# Patient Record
Sex: Female | Born: 1980 | Hispanic: No | Marital: Married | Smoking: Never smoker
Health system: Southern US, Community
[De-identification: ages and names within clinical notes are randomized; demographics above are authoritative.]

## PROBLEM LIST (undated history)

## (undated) ENCOUNTER — Inpatient Hospital Stay (HOSPITAL_COMMUNITY): Payer: Self-pay

## (undated) DIAGNOSIS — Z789 Other specified health status: Secondary | ICD-10-CM

## (undated) HISTORY — PX: NO PAST SURGERIES: SHX2092

---

## 2015-01-11 ENCOUNTER — Inpatient Hospital Stay (HOSPITAL_COMMUNITY): Admission: AD | Admit: 2015-01-11 | Payer: Self-pay | Source: Ambulatory Visit | Admitting: Obstetrics & Gynecology

## 2015-08-24 LAB — OB RESULTS CONSOLE HIV ANTIBODY (ROUTINE TESTING): HIV: NONREACTIVE

## 2015-08-24 LAB — OB RESULTS CONSOLE GC/CHLAMYDIA
Chlamydia: NEGATIVE
Gonorrhea: NEGATIVE

## 2015-08-24 LAB — OB RESULTS CONSOLE RUBELLA ANTIBODY, IGM: Rubella: IMMUNE

## 2015-08-24 LAB — OB RESULTS CONSOLE RPR: RPR: NONREACTIVE

## 2015-08-24 LAB — OB RESULTS CONSOLE HEPATITIS B SURFACE ANTIGEN: HEP B S AG: NEGATIVE

## 2015-09-06 ENCOUNTER — Other Ambulatory Visit: Payer: Self-pay | Admitting: Obstetrics & Gynecology

## 2015-09-06 DIAGNOSIS — E049 Nontoxic goiter, unspecified: Secondary | ICD-10-CM

## 2015-09-11 ENCOUNTER — Ambulatory Visit
Admission: RE | Admit: 2015-09-11 | Discharge: 2015-09-11 | Disposition: A | Discharge status: Home / Self Care | Payer: Self-pay | Source: Ambulatory Visit | Attending: Obstetrics & Gynecology | Admitting: Obstetrics & Gynecology

## 2015-09-11 DIAGNOSIS — E049 Nontoxic goiter, unspecified: Secondary | ICD-10-CM

## 2015-09-15 ENCOUNTER — Other Ambulatory Visit: Payer: Self-pay | Admitting: Obstetrics & Gynecology

## 2015-09-15 DIAGNOSIS — E041 Nontoxic single thyroid nodule: Secondary | ICD-10-CM

## 2015-09-27 ENCOUNTER — Ambulatory Visit
Admission: RE | Admit: 2015-09-27 | Discharge: 2015-09-27 | Disposition: A | Discharge status: Home / Self Care | Payer: 59 | Source: Ambulatory Visit | Attending: Obstetrics & Gynecology | Admitting: Obstetrics & Gynecology

## 2015-09-27 ENCOUNTER — Other Ambulatory Visit (HOSPITAL_COMMUNITY)
Admission: RE | Admit: 2015-09-27 | Discharge: 2015-09-27 | Disposition: A | Discharge status: Home / Self Care | Payer: 59 | Source: Ambulatory Visit | Attending: Physician Assistant | Admitting: Physician Assistant

## 2015-09-27 DIAGNOSIS — E041 Nontoxic single thyroid nodule: Secondary | ICD-10-CM

## 2015-09-27 NOTE — Procedures (Signed)
Using direct ultrasound guidance, 4 passes were made using needles into the nodule within the right lobe of the thyroid.   Ultrasound was used to confirm needle placements on all occasions.   Specimens were sent to Pathology for analysis.  Lorenza Winkleman S Dareon Nunziato PA-C 09/27/2015 8:44 AM

## 2015-11-24 ENCOUNTER — Inpatient Hospital Stay (HOSPITAL_COMMUNITY): Payer: 59

## 2015-11-24 ENCOUNTER — Encounter (HOSPITAL_COMMUNITY): Payer: Self-pay | Admitting: *Deleted

## 2015-11-24 ENCOUNTER — Inpatient Hospital Stay (HOSPITAL_COMMUNITY)
Admission: AD | Admit: 2015-11-24 | Discharge: 2015-11-24 | Disposition: A | Discharge status: Home / Self Care | Payer: 59 | Source: Ambulatory Visit | Attending: Obstetrics and Gynecology | Admitting: Obstetrics and Gynecology

## 2015-11-24 DIAGNOSIS — O26852 Spotting complicating pregnancy, second trimester: Secondary | ICD-10-CM | POA: Diagnosis not present

## 2015-11-24 DIAGNOSIS — K5900 Constipation, unspecified: Secondary | ICD-10-CM | POA: Diagnosis not present

## 2015-11-24 DIAGNOSIS — O26851 Spotting complicating pregnancy, first trimester: Secondary | ICD-10-CM

## 2015-11-24 DIAGNOSIS — Z3A23 23 weeks gestation of pregnancy: Secondary | ICD-10-CM

## 2015-11-24 HISTORY — DX: Other specified health status: Z78.9

## 2015-11-24 LAB — URINALYSIS, ROUTINE W REFLEX MICROSCOPIC
Bilirubin Urine: NEGATIVE
Glucose, UA: NEGATIVE mg/dL
Hgb urine dipstick: NEGATIVE
Ketones, ur: 15 mg/dL — AB
LEUKOCYTES UA: NEGATIVE
NITRITE: NEGATIVE
PH: 5.5 (ref 5.0–8.0)
Protein, ur: NEGATIVE mg/dL
SPECIFIC GRAVITY, URINE: 1.01 (ref 1.005–1.030)

## 2015-11-24 NOTE — MAU Provider Note (Signed)
History     CSN: [id number]  Arrival date and time: 11/24/15 1432   First Provider Initiated Contact with Patient 11/24/15 1521      Chief Complaint  Patient presents with  . Vaginal Bleeding   HPI Ms. Stephanie Webster is a 35 y.o. G2P1001 at 7279w3d who presents to MAU today with complaint of spotting since last night. She has not needed a pad. She states only light pink noted with wiping. She states that she has been constipated, but had multiple BMs yesterday and initially thought this might be rectal bleeding. She now thinks it is vaginal bleeding since it has continued today. She denies abdominal pain, contractions, LOF, UTI symptoms, recent intercourse or complications with the pregnancy. She reports normal fetal movement.  OB History    Gravida Para Term Preterm AB TAB SAB Ectopic Multiple Living   2 1 1       1       Past Medical History  Diagnosis Date  . Medical history non-contributory     Past Surgical History  Procedure Laterality Date  . No past surgeries      History reviewed. No pertinent family history.  Social History  Substance Use Topics  . Smoking status: Never Smoker   . Smokeless tobacco: None  . Alcohol Use: No    Allergies: No Known Allergies  No prescriptions prior to admission    Review of Systems  Constitutional: Negative for fever and malaise/fatigue.  Gastrointestinal: Positive for constipation. Negative for nausea, vomiting, abdominal pain and diarrhea.  Genitourinary: Negative for dysuria, urgency and frequency.       + vaginal bleeding Neg - LOF   Physical Exam   Blood pressure 117/74, pulse 80, temperature 98.3 F (36.8 C), temperature source Oral, resp. rate 16, last menstrual period 06/13/2015.  Physical Exam  Nursing note and vitals reviewed. Constitutional: She is oriented to person, place, and time. She appears well-developed and well-nourished. No distress.  HENT:  Head: Normocephalic and atraumatic.   Cardiovascular: Normal rate.   Respiratory: Effort normal.  GI: Soft. She exhibits no distension and no mass. There is no tenderness. There is no rebound and no guarding.  Genitourinary: Uterus is enlarged. Uterus is not tender. Cervix exhibits no motion tenderness, no discharge and no friability. No bleeding in the vagina. Vaginal discharge (small amount of mucus discharge noted) found.  Neurological: She is alert and oriented to person, place, and time.  Skin: Skin is warm and dry. No erythema.  Psychiatric: She has a normal mood and affect.    Results for orders placed or performed during the hospital encounter of 11/24/15 (from the past 24 hour(s))  Urinalysis, Routine w reflex microscopic (not at [facility])     Status: Abnormal   Collection Time: 11/24/15  2:35 PM  Result Value Ref Range   Color, Urine YELLOW YELLOW   APPearance CLEAR CLEAR   Specific Gravity, Urine 1.010 1.005 - 1.030   pH 5.5 5.0 - 8.0   Glucose, UA NEGATIVE NEGATIVE mg/dL   Hgb urine dipstick NEGATIVE NEGATIVE   Bilirubin Urine NEGATIVE NEGATIVE   Ketones, ur 15 (A) NEGATIVE mg/dL   Protein, ur NEGATIVE NEGATIVE mg/dL   Nitrite NEGATIVE NEGATIVE   Leukocytes, UA NEGATIVE NEGATIVE    Fetal Monitoring: Baseline: 140 bpm Variability: moderate Accelerations: 10 x 10 Decelerations: none Contractions: none  MAU Course  Procedures None  MDM UA today Discussed patient with Dr Thana AtesGrewel. Recommends US today for placental location and cervical length prior  to vaginal exam.  Preliminary US shows no evidence of placenta previa or abruption, normal AFI and cervical length of 3.6 cm Discussed results with Dr. Thana Ates. Ok for discharge at this time. Follow-up as scheduled.  Assessment and Plan  A: SIUP at [redacted]w[redacted]d Spotting in pregnancy, second trimester  P: Discharge home Bleeding/preterm labor precautions discussed Patient advised to follow-up with [facility] as scheduled for routine prenatal care or  sooner PRN Patient may return to MAU as needed or if her condition were to change or worsen   Stephanie Lowenstein, PA-C  11/24/2015, 5:32 PM

## 2015-11-24 NOTE — MAU Note (Signed)
Pt states she had pink spotting yesterday and today when she used the bathroom.

## 2015-11-24 NOTE — Discharge Instructions (Signed)
Vaginal Bleeding During Pregnancy, Second Trimester ° A small amount of bleeding (spotting) from the vagina is common in pregnancy. Sometimes the bleeding is normal and is not a problem, and sometimes it is a sign of something serious. Be sure to tell your doctor about any bleeding from your vagina right away. °HOME CARE °· Watch your condition for any changes. °· Follow your doctor's instructions about how active you can be. °· If you are on bed rest: °¨ You may need to stay in bed and only get up to use the bathroom. °¨ You may be allowed to do some activities. °¨ If you need help, make plans for someone to help you. °· Write down: °¨ The number of pads you use each day. °¨ How often you change pads. °¨ How soaked (saturated) your pads are. °· Do not use tampons. °· Do not douche. °· Do not have sex or orgasms until your doctor says it is okay. °· If you pass any tissue from your vagina, save the tissue so you can show it to your doctor. °· Only take medicines as told by your doctor. °· Do not take aspirin because it can make you bleed. °· Do not exercise, lift heavy weights, or do any activities that take a lot of energy and effort unless your doctor says it is okay. °· Keep all follow-up visits as told by your doctor. °GET HELP IF:  °· You bleed from your vagina. °· You have cramps. °· You have labor pains. °· You have a fever that does not go away after you take medicine. °GET HELP RIGHT AWAY IF: °· You have very bad cramps in your back or belly (abdomen). °· You have contractions. °· You have chills. °· You pass large clots or tissue from your vagina. °· You bleed more. °· You feel light-headed or weak. °· You pass out (faint). °· You are leaking fluid or have a gush of fluid from your vagina. °MAKE SURE YOU: °· Understand these instructions. °· Will watch your condition. °· Will get help right away if you are not doing well or get worse. °  °This information is not intended to replace advice given to you by  your health care provider. Make sure you discuss any questions you have with your health care provider. °  °Document Released: 12/13/2013 Document Reviewed: 12/13/2013 °Elsevier Interactive Patient Education ©2016 [employer] ° °

## 2016-01-18 LAB — OB RESULTS CONSOLE GBS: GBS: NEGATIVE

## 2016-03-17 ENCOUNTER — Inpatient Hospital Stay (HOSPITAL_COMMUNITY): Payer: 59 | Admitting: Anesthesiology

## 2016-03-17 ENCOUNTER — Encounter (HOSPITAL_COMMUNITY)
Admission: AD | Disposition: A | Discharge status: Home / Self Care | Payer: Self-pay | Source: Ambulatory Visit | Attending: Obstetrics & Gynecology

## 2016-03-17 ENCOUNTER — Encounter (HOSPITAL_COMMUNITY): Payer: Self-pay | Admitting: *Deleted

## 2016-03-17 ENCOUNTER — Inpatient Hospital Stay (HOSPITAL_COMMUNITY)
Admission: AD | Admit: 2016-03-17 | Discharge: 2016-03-21 | DRG: 765 | Disposition: A | Discharge status: Home / Self Care | Payer: 59 | Source: Ambulatory Visit | Attending: Obstetrics & Gynecology | Admitting: Obstetrics & Gynecology

## 2016-03-17 DIAGNOSIS — Z3490 Encounter for supervision of normal pregnancy, unspecified, unspecified trimester: Secondary | ICD-10-CM

## 2016-03-17 DIAGNOSIS — Z98891 History of uterine scar from previous surgery: Secondary | ICD-10-CM

## 2016-03-17 DIAGNOSIS — Z3A39 39 weeks gestation of pregnancy: Secondary | ICD-10-CM | POA: Diagnosis not present

## 2016-03-17 DIAGNOSIS — O324XX0 Maternal care for high head at term, not applicable or unspecified: Secondary | ICD-10-CM | POA: Diagnosis present

## 2016-03-17 DIAGNOSIS — O4202 Full-term premature rupture of membranes, onset of labor within 24 hours of rupture: Secondary | ICD-10-CM | POA: Diagnosis present

## 2016-03-17 DIAGNOSIS — O411230 Chorioamnionitis, third trimester, not applicable or unspecified: Secondary | ICD-10-CM | POA: Diagnosis present

## 2016-03-17 DIAGNOSIS — E041 Nontoxic single thyroid nodule: Secondary | ICD-10-CM | POA: Diagnosis present

## 2016-03-17 DIAGNOSIS — O99284 Endocrine, nutritional and metabolic diseases complicating childbirth: Secondary | ICD-10-CM | POA: Diagnosis present

## 2016-03-17 LAB — CBC
HCT: 37.6 % (ref 36.0–46.0)
HEMOGLOBIN: 12.7 g/dL (ref 12.0–15.0)
MCH: 28.8 pg (ref 26.0–34.0)
MCHC: 33.8 g/dL (ref 30.0–36.0)
MCV: 85.3 fL (ref 78.0–100.0)
PLATELETS: 241 10*3/uL (ref 150–400)
RBC: 4.41 MIL/uL (ref 3.87–5.11)
RDW: 13.9 % (ref 11.5–15.5)
WBC: 6.5 10*3/uL (ref 4.0–10.5)

## 2016-03-17 LAB — ABO/RH: ABO/RH(D): B POS

## 2016-03-17 LAB — TYPE AND SCREEN
ABO/RH(D): B POS
Antibody Screen: NEGATIVE

## 2016-03-17 LAB — POCT FERN TEST: POCT FERN TEST: POSITIVE

## 2016-03-17 SURGERY — Surgical Case
Anesthesia: Epidural

## 2016-03-17 MED ORDER — SODIUM CHLORIDE 0.9 % IV SOLN
2.0000 g | Freq: Four times a day (QID) | INTRAVENOUS | Status: DC
  Administered 2016-03-17: 2 g via INTRAVENOUS
  Filled 2016-03-17 (×5): qty 2000

## 2016-03-17 MED ORDER — LIDOCAINE HCL (PF) 1 % IJ SOLN
INTRAMUSCULAR | Status: DC | PRN
  Administered 2016-03-17: 6 mL via EPIDURAL
  Administered 2016-03-17: 4 mL

## 2016-03-17 MED ORDER — FENTANYL 2.5 MCG/ML BUPIVACAINE 1/10 % EPIDURAL INFUSION (WH - ANES)
INTRAMUSCULAR | Status: AC
  Filled 2016-03-17: qty 125

## 2016-03-17 MED ORDER — EPHEDRINE 5 MG/ML INJ: 10.0000 mg | INTRAVENOUS | Status: DC | PRN

## 2016-03-17 MED ORDER — BUTORPHANOL TARTRATE 1 MG/ML IJ SOLN: 1.0000 mg | INTRAMUSCULAR | Status: DC | PRN

## 2016-03-17 MED ORDER — TERBUTALINE SULFATE 1 MG/ML IJ SOLN: 0.2500 mg | Freq: Once | INTRAMUSCULAR | Status: DC | PRN

## 2016-03-17 MED ORDER — PHENYLEPHRINE 40 MCG/ML (10ML) SYRINGE FOR IV PUSH (FOR BLOOD PRESSURE SUPPORT)
80.0000 ug | PREFILLED_SYRINGE | INTRAVENOUS | Status: DC | PRN
  Administered 2016-03-17: 80 ug via INTRAVENOUS

## 2016-03-17 MED ORDER — FENTANYL 2.5 MCG/ML BUPIVACAINE 1/10 % EPIDURAL INFUSION (WH - ANES)
14.0000 mL/h | INTRAMUSCULAR | Status: DC | PRN
  Administered 2016-03-17: 14 mL/h via EPIDURAL

## 2016-03-17 MED ORDER — OXYTOCIN 40 UNITS IN LACTATED RINGERS INFUSION - SIMPLE MED
1.0000 m[IU]/min | INTRAVENOUS | Status: DC
  Administered 2016-03-17: 2 m[IU]/min via INTRAVENOUS
  Filled 2016-03-17: qty 1000

## 2016-03-17 MED ORDER — CLINDAMYCIN PHOSPHATE 900 MG/50ML IV SOLN
INTRAVENOUS | Status: DC | PRN
  Administered 2016-03-17: 900 mg via INTRAVENOUS

## 2016-03-17 MED ORDER — SODIUM BICARBONATE 8.4 % IV SOLN
INTRAVENOUS | Status: DC | PRN
  Administered 2016-03-17 (×2): 5 mL via EPIDURAL

## 2016-03-17 MED ORDER — OXYTOCIN BOLUS FROM INFUSION: 500.0000 mL | Freq: Once | INTRAVENOUS | Status: DC

## 2016-03-17 MED ORDER — GENTAMICIN SULFATE 40 MG/ML IJ SOLN
INTRAVENOUS | Status: DC | PRN
  Administered 2016-03-17: 320 mg via INTRAVENOUS

## 2016-03-17 MED ORDER — DIPHENHYDRAMINE HCL 50 MG/ML IJ SOLN: 12.5000 mg | INTRAMUSCULAR | Status: DC | PRN

## 2016-03-17 MED ORDER — LACTATED RINGERS IV SOLN
500.0000 mL | Freq: Once | INTRAVENOUS | Status: AC
  Administered 2016-03-17: 500 mL via INTRAVENOUS

## 2016-03-17 MED ORDER — OXYCODONE-ACETAMINOPHEN 5-325 MG PO TABS: 2.0000 | ORAL_TABLET | ORAL | Status: DC | PRN

## 2016-03-17 MED ORDER — LIDOCAINE HCL (PF) 1 % IJ SOLN
30.0000 mL | INTRAMUSCULAR | Status: DC | PRN
  Filled 2016-03-17: qty 30

## 2016-03-17 MED ORDER — LACTATED RINGERS IV SOLN
500.0000 mL | INTRAVENOUS | Status: DC | PRN
  Administered 2016-03-17: 500 mL via INTRAVENOUS

## 2016-03-17 MED ORDER — LACTATED RINGERS IV SOLN
INTRAVENOUS | Status: DC
  Administered 2016-03-17 (×3): via INTRAVENOUS

## 2016-03-17 MED ORDER — ACETAMINOPHEN 325 MG PO TABS: 650.0000 mg | ORAL_TABLET | ORAL | Status: DC | PRN

## 2016-03-17 MED ORDER — SOD CITRATE-CITRIC ACID 500-334 MG/5ML PO SOLN
30.0000 mL | ORAL | Status: DC | PRN
  Administered 2016-03-17: 30 mL via ORAL
  Filled 2016-03-17: qty 15

## 2016-03-17 MED ORDER — CLINDAMYCIN PHOSPHATE 900 MG/50ML IV SOLN
900.0000 mg | Freq: Once | INTRAVENOUS | Status: DC
  Filled 2016-03-17: qty 50

## 2016-03-17 MED ORDER — EPHEDRINE SULFATE 50 MG/ML IJ SOLN
INTRAMUSCULAR | Status: DC | PRN
  Administered 2016-03-17: 5 mg via INTRAVENOUS

## 2016-03-17 MED ORDER — GENTAMICIN SULFATE 40 MG/ML IJ SOLN
5.0000 mg/kg | Freq: Once | INTRAVENOUS | Status: DC
  Filled 2016-03-17: qty 8

## 2016-03-17 MED ORDER — OXYTOCIN 40 UNITS IN LACTATED RINGERS INFUSION - SIMPLE MED: 2.5000 [IU]/h | INTRAVENOUS | Status: DC

## 2016-03-17 MED ORDER — FLEET ENEMA 7-19 GM/118ML RE ENEM: 1.0000 | ENEMA | RECTAL | Status: DC | PRN

## 2016-03-17 MED ORDER — PHENYLEPHRINE 40 MCG/ML (10ML) SYRINGE FOR IV PUSH (FOR BLOOD PRESSURE SUPPORT): 80.0000 ug | PREFILLED_SYRINGE | INTRAVENOUS | Status: DC | PRN

## 2016-03-17 MED ORDER — PHENYLEPHRINE 40 MCG/ML (10ML) SYRINGE FOR IV PUSH (FOR BLOOD PRESSURE SUPPORT)
PREFILLED_SYRINGE | INTRAVENOUS | Status: AC
  Filled 2016-03-17: qty 20

## 2016-03-17 MED ORDER — ONDANSETRON HCL 4 MG/2ML IJ SOLN: 4.0000 mg | Freq: Four times a day (QID) | INTRAMUSCULAR | Status: DC | PRN

## 2016-03-17 MED ORDER — MEPERIDINE HCL 25 MG/ML IJ SOLN
INTRAMUSCULAR | Status: AC
  Filled 2016-03-17: qty 1

## 2016-03-17 MED ORDER — OXYCODONE-ACETAMINOPHEN 5-325 MG PO TABS: 1.0000 | ORAL_TABLET | ORAL | Status: DC | PRN

## 2016-03-17 SURGICAL SUPPLY — 35 items
BENZOIN TINCTURE PRP APPL 2/3 (GAUZE/BANDAGES/DRESSINGS) ×3 IMPLANT
CHLORAPREP W/TINT 26ML (MISCELLANEOUS) ×3 IMPLANT
CLAMP CORD UMBIL (MISCELLANEOUS) IMPLANT
CLOSURE STERI-STRIP 1/2X4 (GAUZE/BANDAGES/DRESSINGS) ×1
CLOSURE WOUND 1/2 X4 (GAUZE/BANDAGES/DRESSINGS) ×1
CLOTH BEACON ORANGE TIMEOUT ST (SAFETY) IMPLANT
CLSR STERI-STRIP ANTIMIC 1/2X4 (GAUZE/BANDAGES/DRESSINGS) ×2 IMPLANT
DRSG OPSITE POSTOP 4X10 (GAUZE/BANDAGES/DRESSINGS) ×3 IMPLANT
ELECT REM PT RETURN 9FT ADLT (ELECTROSURGICAL) ×3
ELECTRODE REM PT RTRN 9FT ADLT (ELECTROSURGICAL) ×1 IMPLANT
EXTRACTOR VACUUM KIWI (MISCELLANEOUS) IMPLANT
GLOVE BIO SURGEON STRL SZ 6 (GLOVE) ×3 IMPLANT
GLOVE BIOGEL PI IND STRL 6 (GLOVE) ×2 IMPLANT
GLOVE BIOGEL PI IND STRL 7.0 (GLOVE) ×1 IMPLANT
GLOVE BIOGEL PI INDICATOR 6 (GLOVE) ×4
GLOVE BIOGEL PI INDICATOR 7.0 (GLOVE) ×2
GOWN STRL REUS W/TWL LRG LVL3 (GOWN DISPOSABLE) ×6 IMPLANT
KIT ABG SYR 3ML LUER SLIP (SYRINGE) IMPLANT
LIQUID BAND (GAUZE/BANDAGES/DRESSINGS) IMPLANT
NEEDLE HYPO 25X5/8 SAFETYGLIDE (NEEDLE) ×3 IMPLANT
NS IRRIG 1000ML POUR BTL (IV SOLUTION) ×3 IMPLANT
PACK C SECTION WH (CUSTOM PROCEDURE TRAY) ×3 IMPLANT
PAD OB MATERNITY 4.3X12.25 (PERSONAL CARE ITEMS) ×3 IMPLANT
PENCIL SMOKE EVAC W/HOLSTER (ELECTROSURGICAL) ×3 IMPLANT
SPONGE SURGIFOAM ABS GEL 12-7 (HEMOSTASIS) ×3 IMPLANT
STRIP CLOSURE SKIN 1/2X4 (GAUZE/BANDAGES/DRESSINGS) ×2 IMPLANT
SUT CHROMIC 0 CTX 36 (SUTURE) ×12 IMPLANT
SUT CHROMIC 3 0 SH 27 (SUTURE) ×3 IMPLANT
SUT MON AB 2-0 CT1 27 (SUTURE) ×3 IMPLANT
SUT PDS AB 0 CT1 27 (SUTURE) IMPLANT
SUT PLAIN 0 NONE (SUTURE) IMPLANT
SUT VIC AB 0 CT1 36 (SUTURE) ×6 IMPLANT
SUT VIC AB 4-0 KS 27 (SUTURE) ×3 IMPLANT
TOWEL OR 17X24 6PK STRL BLUE (TOWEL DISPOSABLE) ×3 IMPLANT
TRAY FOLEY CATH SILVER 14FR (SET/KITS/TRAYS/PACK) IMPLANT

## 2016-03-17 NOTE — Progress Notes (Signed)
Dr. Langston MaskerMorris notified of a G2P1 at 4975w5d.  Notified that pt is fern positive.  Notified that pt is 2, 80, -3, and vertex.  Notified that pt is having no contractions.  Provider states to go ahead and release the admission orders for her scheduled induction.

## 2016-03-17 NOTE — Anesthesia Procedure Notes (Signed)

## 2016-03-17 NOTE — Progress Notes (Signed)
The patient has been pushing effectively x 2 hours with minimal descent in fetal vertex; noted narrow pubic arch.  The patient now has chorioamnionitis and fetal tachycardia.  The patient is offered vacuum assistance and decline based on risk.  She declines further pushing efforts and would like to proceed to primary C/S.  She is informed of the risk of bleeding, infection, scarring and damage to surrounding structures.  She understands the 1% risk of uterine rupture in subsequent pregnancies.  All of her questions were answered and the patient wishes to proceed.  Mitchel HonourMegan Herman Mell, DO

## 2016-03-17 NOTE — H&P (Signed)
Stephanie Webster is a 35 y.o. female presenting for PROM when she awoke at 630 am today.  No CTX.  Active FM.  No VB.  Antepartum course uncomplicated except a thyroid nodule; s/p eval by ENT with planned postpartum follow up.  GBS negative.  OB History    Gravida Para Term Preterm AB Living   2 1 1     1    SAB TAB Ectopic Multiple Live Births                 Past Medical History:  Diagnosis Date  . Medical history non-contributory    Past Surgical History:  Procedure Laterality Date  . NO PAST SURGERIES     Family History: family history is not on file. Social History:  reports that she has never smoked. She has never used smokeless tobacco. She reports that she does not drink alcohol or use drugs.     Maternal Diabetes: No Genetic Screening: Normal Maternal Ultrasounds/Referrals: Normal Fetal Ultrasounds or other Referrals:  None Maternal Substance Abuse:  No Significant Maternal Medications:  None Significant Maternal Lab Results:  Lab values include: Group B Strep negative Other Comments:  None  ROS Maternal Medical History:  Reason for admission: Rupture of membranes.   Contractions: Frequency: rare.   Perceived severity is mild.    Fetal activity: Perceived fetal activity is normal.   Last perceived fetal movement was within the past hour.    Prenatal complications: no prenatal complications Prenatal Complications - Diabetes: none.    Dilation: 2 Effacement (%): 80 Station: -3 Exam by:: Ronna PolioElizabeth D'Andrea, RN Blood pressure 115/78, pulse 102, temperature 97.6 F (36.4 C), temperature source Oral, resp. rate 18, last menstrual period 06/13/2015. Maternal Exam:  Uterine Assessment: Contraction strength is mild.  Contraction frequency is rare.   Abdomen: Patient reports no abdominal tenderness. Fundal height is c/w dates.   Estimated fetal weight is 7#6.       Physical Exam  Constitutional: She is oriented to person, place, and time. She appears  well-developed and well-nourished.  GI: Soft. There is no rebound and no guarding.  Neurological: She is alert and oriented to person, place, and time.  Skin: Skin is warm and dry.  Psychiatric: She has a normal mood and affect. Her behavior is normal.    Prenatal labs: ABO, Rh: --/--/B POS (08/06 16100916) Antibody: NEG (08/06 0916) Rubella:   RPR:    HBsAg:    HIV:    GBS:     Assessment/Plan: 35yo G3P1011 at 7245w5d with PROM -Start pitocin -Epidural when ready -Anticipate NSVD  Stephanie Webster 03/17/2016, 1:40 PM

## 2016-03-17 NOTE — MAU Note (Signed)
Pt states her water broke around 0630 this morning.  Pt states she had clear watery fluid when she woke up and has also had some white discharged mixed in.  Pt denies contractions.

## 2016-03-17 NOTE — Anesthesia Preprocedure Evaluation (Signed)

## 2016-03-17 NOTE — Anesthesia Pain Management Evaluation Note (Signed)
  CRNA Pain Management Visit Note  Patient: Stephanie Webster, 35 y.o., female  "Hello I am a member of the anesthesia team at [facility]. We have an anesthesia team available at all times to provide care throughout the hospital, including epidural management and anesthesia for C-section. I don't know your plan for the delivery whether it a natural birth, water birth, IV sedation, nitrous supplementation, doula or epidural, but we want to meet your pain goals."   1.Was your pain managed to your expectations on prior hospitalizations?   Yes   2.What is your expectation for pain management during this hospitalization?     Epidural  3.How can we help you reach that goal? Patient would like epidural when labor progresses   Record the patient's initial score and the patient's pain goal.   Pain: 0  Pain Goal: patient unsure [facility] wants you to be able to say your pain was always managed very well.  Rica RecordsICKELTON,Stephanie Webster 03/17/2016

## 2016-03-17 NOTE — Progress Notes (Signed)
Dr. Langston MaskerMorris notified that there is not a bed on Labor and Delivery available for the pt so the pt is being held in MAU.  RN requested that the pt be able to order a meal try and then get up and walk around since pt FHR tracing is reactive and pt is not having contractions.  Provider states that is ok by her.

## 2016-03-18 ENCOUNTER — Encounter (HOSPITAL_COMMUNITY): Payer: Self-pay | Admitting: *Deleted

## 2016-03-18 ENCOUNTER — Inpatient Hospital Stay (HOSPITAL_COMMUNITY): Admission: RE | Admit: 2016-03-18 | Payer: 59 | Source: Ambulatory Visit

## 2016-03-18 DIAGNOSIS — Z98891 History of uterine scar from previous surgery: Secondary | ICD-10-CM

## 2016-03-18 LAB — CBC
HCT: 32.6 % — ABNORMAL LOW (ref 36.0–46.0)
Hemoglobin: 11.2 g/dL — ABNORMAL LOW (ref 12.0–15.0)
MCH: 29.4 pg (ref 26.0–34.0)
MCHC: 34.4 g/dL (ref 30.0–36.0)
MCV: 85.6 fL (ref 78.0–100.0)
PLATELETS: 217 10*3/uL (ref 150–400)
RBC: 3.81 MIL/uL — AB (ref 3.87–5.11)
RDW: 13.9 % (ref 11.5–15.5)
WBC: 12.3 10*3/uL — AB (ref 4.0–10.5)

## 2016-03-18 LAB — RPR: RPR Ser Ql: NONREACTIVE

## 2016-03-18 MED ORDER — OXYTOCIN 10 UNIT/ML IJ SOLN
INTRAVENOUS | Status: DC | PRN
  Administered 2016-03-18: 40 [IU] via INTRAVENOUS

## 2016-03-18 MED ORDER — SIMETHICONE 80 MG PO CHEW
80.0000 mg | CHEWABLE_TABLET | ORAL | Status: DC
  Administered 2016-03-19 – 2016-03-21 (×2): 80 mg via ORAL
  Filled 2016-03-18 (×3): qty 1

## 2016-03-18 MED ORDER — TETANUS-DIPHTH-ACELL PERTUSSIS 5-2.5-18.5 LF-MCG/0.5 IM SUSP: 0.5000 mL | Freq: Once | INTRAMUSCULAR | Status: DC

## 2016-03-18 MED ORDER — SENNOSIDES-DOCUSATE SODIUM 8.6-50 MG PO TABS
2.0000 | ORAL_TABLET | ORAL | Status: DC
  Administered 2016-03-19 – 2016-03-21 (×2): 2 via ORAL
  Filled 2016-03-18 (×3): qty 2

## 2016-03-18 MED ORDER — IBUPROFEN 600 MG PO TABS
600.0000 mg | ORAL_TABLET | Freq: Four times a day (QID) | ORAL | Status: DC
  Administered 2016-03-18 – 2016-03-21 (×13): 600 mg via ORAL
  Filled 2016-03-18 (×14): qty 1

## 2016-03-18 MED ORDER — DEXAMETHASONE SODIUM PHOSPHATE 4 MG/ML IJ SOLN
INTRAMUSCULAR | Status: AC
  Filled 2016-03-18: qty 1

## 2016-03-18 MED ORDER — LACTATED RINGERS IV SOLN
INTRAVENOUS | Status: DC | PRN
  Administered 2016-03-17: via INTRAVENOUS

## 2016-03-18 MED ORDER — DIBUCAINE 1 % RE OINT: 1.0000 "application " | TOPICAL_OINTMENT | RECTAL | Status: DC | PRN

## 2016-03-18 MED ORDER — MEPERIDINE HCL 25 MG/ML IJ SOLN
INTRAMUSCULAR | Status: DC | PRN
  Administered 2016-03-18: 25 mg via INTRAVENOUS

## 2016-03-18 MED ORDER — OXYTOCIN 40 UNITS IN LACTATED RINGERS INFUSION - SIMPLE MED: 2.5000 [IU]/h | INTRAVENOUS | Status: AC

## 2016-03-18 MED ORDER — DEXAMETHASONE SODIUM PHOSPHATE 4 MG/ML IJ SOLN
INTRAMUSCULAR | Status: DC | PRN
  Administered 2016-03-18: 4 mg via INTRAVENOUS

## 2016-03-18 MED ORDER — MORPHINE SULFATE (PF) 0.5 MG/ML IJ SOLN
INTRAMUSCULAR | Status: AC
  Filled 2016-03-18: qty 10

## 2016-03-18 MED ORDER — ONDANSETRON HCL 4 MG/2ML IJ SOLN
INTRAMUSCULAR | Status: AC
  Filled 2016-03-18: qty 2

## 2016-03-18 MED ORDER — LACTATED RINGERS IV SOLN
INTRAVENOUS | Status: DC
  Administered 2016-03-18 (×2): via INTRAVENOUS

## 2016-03-18 MED ORDER — SIMETHICONE 80 MG PO CHEW: 80.0000 mg | CHEWABLE_TABLET | ORAL | Status: DC | PRN

## 2016-03-18 MED ORDER — PHENYLEPHRINE HCL 10 MG/ML IJ SOLN
INTRAMUSCULAR | Status: DC | PRN
  Administered 2016-03-18 (×3): 80 ug via INTRAVENOUS
  Administered 2016-03-18: 40 ug via INTRAVENOUS
  Administered 2016-03-18: 80 ug via INTRAVENOUS
  Administered 2016-03-18 (×2): 40 ug via INTRAVENOUS
  Administered 2016-03-18: 80 ug via INTRAVENOUS

## 2016-03-18 MED ORDER — DIPHENHYDRAMINE HCL 25 MG PO CAPS: 25.0000 mg | ORAL_CAPSULE | Freq: Four times a day (QID) | ORAL | Status: DC | PRN

## 2016-03-18 MED ORDER — MENTHOL 3 MG MT LOZG: 1.0000 | LOZENGE | OROMUCOSAL | Status: DC | PRN

## 2016-03-18 MED ORDER — PRENATAL MULTIVITAMIN CH
1.0000 | ORAL_TABLET | Freq: Every day | ORAL | Status: DC
  Administered 2016-03-18 – 2016-03-21 (×4): 1 via ORAL
  Filled 2016-03-18 (×4): qty 1

## 2016-03-18 MED ORDER — OXYCODONE-ACETAMINOPHEN 5-325 MG PO TABS: 2.0000 | ORAL_TABLET | ORAL | Status: DC | PRN

## 2016-03-18 MED ORDER — SIMETHICONE 80 MG PO CHEW
80.0000 mg | CHEWABLE_TABLET | Freq: Three times a day (TID) | ORAL | Status: DC
  Administered 2016-03-18 – 2016-03-21 (×7): 80 mg via ORAL
  Filled 2016-03-18 (×8): qty 1

## 2016-03-18 MED ORDER — ACETAMINOPHEN 325 MG PO TABS: 650.0000 mg | ORAL_TABLET | ORAL | Status: DC | PRN

## 2016-03-18 MED ORDER — OXYTOCIN 10 UNIT/ML IJ SOLN
INTRAMUSCULAR | Status: AC
  Filled 2016-03-18: qty 4

## 2016-03-18 MED ORDER — FENTANYL CITRATE (PF) 100 MCG/2ML IJ SOLN
INTRAMUSCULAR | Status: DC | PRN
  Administered 2016-03-18 (×2): 50 ug via INTRAVENOUS

## 2016-03-18 MED ORDER — WITCH HAZEL-GLYCERIN EX PADS: 1.0000 "application " | MEDICATED_PAD | CUTANEOUS | Status: DC | PRN

## 2016-03-18 MED ORDER — PHENYLEPHRINE 40 MCG/ML (10ML) SYRINGE FOR IV PUSH (FOR BLOOD PRESSURE SUPPORT)
PREFILLED_SYRINGE | INTRAVENOUS | Status: AC
  Filled 2016-03-18: qty 20

## 2016-03-18 MED ORDER — SCOPOLAMINE 1 MG/3DAYS TD PT72
MEDICATED_PATCH | TRANSDERMAL | Status: AC
  Filled 2016-03-18: qty 1

## 2016-03-18 MED ORDER — COCONUT OIL OIL: 1.0000 "application " | TOPICAL_OIL | Status: DC | PRN

## 2016-03-18 MED ORDER — ZOLPIDEM TARTRATE 5 MG PO TABS: 5.0000 mg | ORAL_TABLET | Freq: Every evening | ORAL | Status: DC | PRN

## 2016-03-18 MED ORDER — MORPHINE SULFATE (PF) 0.5 MG/ML IJ SOLN
INTRAMUSCULAR | Status: DC | PRN
  Administered 2016-03-18: .5 mg via INTRAVENOUS
  Administered 2016-03-18: 4 mg via EPIDURAL
  Administered 2016-03-18: .5 mg via INTRAVENOUS

## 2016-03-18 MED ORDER — OXYCODONE-ACETAMINOPHEN 5-325 MG PO TABS
1.0000 | ORAL_TABLET | ORAL | Status: DC | PRN
Start: 2016-03-18 — End: 2016-03-21

## 2016-03-18 MED ORDER — ONDANSETRON HCL 4 MG/2ML IJ SOLN
INTRAMUSCULAR | Status: DC | PRN
  Administered 2016-03-18: 4 mg via INTRAVENOUS

## 2016-03-18 MED ORDER — FENTANYL CITRATE (PF) 100 MCG/2ML IJ SOLN
INTRAMUSCULAR | Status: AC
  Filled 2016-03-18: qty 2

## 2016-03-18 MED ORDER — EPHEDRINE 5 MG/ML INJ
INTRAVENOUS | Status: AC
  Filled 2016-03-18: qty 10

## 2016-03-18 NOTE — Lactation Note (Signed)
This note was copied from a baby's chart. Lactation Consultation Note  Patient Name: Stephanie Renaldo HarrisonDeepti Clipper ZOXWR'UToday's Date: 03/18/2016 Reason for consult: Follow-up assessment;Difficult latch Baby 13 hours old. Mom reports that she has just given baby a few drops of EBM with spoon. Assisted mom to latch baby to right breast in football position. Baby fussy at breast and not wanting to latch. Mom not able to express colostrum from right breast. Baby would not suckle this LC's gloved finger, but was biting and chomping instead. Assisted mom to hand express and give baby a few more drops of colostrum from left breast. Set mom up with DEBP and reviewed use. Enc mom to put baby to breast with cues, supplement with EBM using spoon, and then post-pump for 15 minutes. Enc mom to offer lots of STS and attempts at the breast, and to pump at least every 2-3 hours. Discussed the normal progression of milk coming to volume and supply and demand. Enc mom to call for assistance as needed.   Maternal Data    Feeding Feeding Type: Breast Fed Length of feed: 0 min  LATCH Score/Interventions Latch: Too sleepy or reluctant, no latch achieved, no sucking elicited. Intervention(s): Skin to skin Intervention(s): Adjust position;Assist with latch;Breast compression  Audible Swallowing: None Intervention(s): Skin to skin;Hand expression  Type of Nipple: Everted at rest and after stimulation  Comfort (Breast/Nipple): Soft / non-tender     Hold (Positioning): Assistance needed to correctly position infant at breast and maintain latch. Intervention(s): Breastfeeding basics reviewed;Support Pillows;Position options;Skin to skin  LATCH Score: 5  Lactation Tools Discussed/Used     Consult Status Consult Status: Follow-up Date: 03/19/16 Follow-up type: In-patient    Geralynn OchsWILLIARD, Karissa Meenan 03/18/2016, 1:56 PM

## 2016-03-18 NOTE — Transfer of Care (Signed)
Immediate Anesthesia Transfer of Care Note  Patient: Loralye Buening  Procedure(s) Performed: Procedure(s): CESAREAN SECTION (N/A)  Patient Location: PACU  Anesthesia Type:Epidural  Level of Consciousness: awake, alert  and oriented  Airway & Oxygen Therapy: Patient Spontanous Breathing  Post-op Assessment: Report given to RN and Post -op Vital signs reviewed and stable  Post vital signs: Reviewed and stable  Last Vitals:  Vitals:   03/17/16 2302 03/17/16 2313  BP:  (!) 127/97  Pulse:    Resp:    Temp: (!) 38.3 C     Last Pain:  Vitals:   03/17/16 2302  TempSrc: Axillary  PainSc:          Complications: No apparent anesthesia complications

## 2016-03-18 NOTE — Anesthesia Postprocedure Evaluation (Signed)
Anesthesia Post Note  Patient: Stephanie Webster  Procedure(s) Performed: Procedure(s) (LRB): CESAREAN SECTION (N/A)  Patient location during evaluation: Mother Baby Anesthesia Type: Epidural Level of consciousness: awake and alert Pain management: pain level controlled Vital Signs Assessment: post-procedure vital signs reviewed and stable Respiratory status: spontaneous breathing and nonlabored ventilation Cardiovascular status: stable Postop Assessment: no headache, patient able to bend at knees, no backache, no signs of nausea or vomiting, epidural receding and adequate PO intake Anesthetic complications: no     Last Vitals:  Vitals:   03/18/16 0440 03/18/16 0545  BP: 106/62 109/64  Pulse: 98 98  Resp: 20 20  Temp: 37.6 C 37.6 C    Last Pain:  Vitals:   03/18/16 0545  TempSrc: Oral  PainSc: 0-No pain   Pain Goal:                 Land O'LakesMalinova,Stephanie Webster

## 2016-03-18 NOTE — Op Note (Signed)
Gurbani Reesor PROCEDURE DATE: 03/17/2016  PREOPERATIVE DIAGNOSIS: Intrauterine pregnancy at  5616w5d weeks gestation, arrest of descent  POSTOPERATIVE DIAGNOSIS: The same  PROCEDURE:    Low Transverse Cesarean Section  SURGEON:  Dr. Mitchel HonourMegan Mieczyslaw Stamas  INDICATIONS: Renaldo HarrisonDeepti Deangelo is a 35 y.o. G2P1001 at 2516w5d scheduled for cesarean section secondary to arrest of descent.  Just prior to calling the C/S, the patient spike T 100.9.  The risks of cesarean section discussed with the patient included but were not limited to: bleeding which may require transfusion or reoperation; infection which may require antibiotics; injury to bowel, bladder, ureters or other surrounding organs; injury to the fetus; need for additional procedures including hysterectomy in the event of a life-threatening hemorrhage; placental abnormalities wth subsequent pregnancies, incisional problems, thromboembolic phenomenon and other postoperative/anesthesia complications. The patient concurred with the proposed plan, giving informed written consent for the procedure.    FINDINGS:  Viable female infant in cephalic presentation, APGARs 9,9:  Weight pending  Clear amniotic fluid.  Intact placenta, three vessel cord.  Grossly normal uterus, ovaries and fallopian tubes. .   ANESTHESIA:    Epidural ESTIMATED BLOOD LOSS: 600 ml SPECIMENS: Placenta sent to pathology COMPLICATIONS: None immediate  PROCEDURE IN DETAIL:  The patient received intravenous antibiotics (Ampicillin, Gentamicin, and Clindamycin) and had sequential compression devices applied to her lower extremities while in the preoperative area.  She was then taken to the operating room where epidural anesthesia was dosed up to surgical level and was found to be adequate. She was then placed in a dorsal supine position with a leftward tilt, and prepped and draped in a sterile manner.  A foley catheter was placed into her bladder and attached to constant gravity.  After an  adequate timeout was performed, a Pfannenstiel skin incision was made with scalpel and carried through to the underlying layer of fascia. The fascia was incised in the midline and this incision was extended bilaterally using the Mayo scissors. Kocher clamps were applied to the superior aspect of the fascial incision and the underlying rectus muscles were dissected off bluntly. A similar process was carried out on the inferior aspect of the facial incision. The rectus muscles were separated in the midline bluntly and the peritoneum was entered bluntly.  A bladder flap was created sharply and developed bluntly. A transverse hysterotomy was made with a scalpel and extended bilaterally bluntly. The bladder blade was then removed. The infant was successfully delivered, and cord was clamped and cut and infant was handed over to awaiting neonatology team. Uterine massage was then administered and the placenta delivered intact with three-vessel cord. The uterus was cleared of clot and debris.  The hysterotomy was closed with 0 chromic.  A second imbricating suture of 0-chromic was used to reinforce the incision and aid in hemostasis.  The uterus was denuded on the left lower uterine segment superficially and was sewn over using 2-0 chromic in a running locked fashion.  The peritoneum and rectus muscles were noted to be hemostatic and were reapproximated using 2-0 monocryl in a running fashion.  The fascia was closed with 0-Vicryl in a running fashion with good restoration of anatomy.  The subcutaneus tissue was copiously irrigated and was reapproximated using three interrupted plain gut stitches.  The skin was closed with 4-0 Vicryl in a subcuticular fashion.  Pt tolerated the procedure will.  All counts were correct x2.  Pt went to the recovery room in stable condition.

## 2016-03-18 NOTE — Lactation Note (Signed)
This note was copied from a baby's chart. Lactation Consultation Note Mom BF her first child that is 588 yrs old for 3 months. Has tiny everted nipples that are short shaft. Small breast, hand express w/colostrum noted. Had increase in breast size during pregnancy. Mom had baby to breast STS, but baby wouldn't latch. Tried cradle and football, baby not interested in BF at this time. Shells given to mom to wear in bra today. Hand pump given to stimulate breast. 21 flange given d/t tiny nipples. Encouraged to pre-pump to evert nipples more for deeper latch. Mom encouraged to feed baby 8-12 times/24 hours and with feeding cues. Referred to Baby and Me Book in Breastfeeding section Pg. 22-23 for position options and Proper latch demonstration. Educated about newborn behavior, I&O, STS, supply and demand. WH/LC brochure given w/resources, support groups and LC services. Answered questions mom had.  Patient Name: Stephanie Webster ZOXWR'UToday's Date: 03/18/2016 Reason for consult: Initial assessment   Maternal Data Has patient been taught Hand Expression?: Yes Does the patient have breastfeeding experience prior to this delivery?: Yes  Feeding Feeding Type: Breast Fed Length of feed: 0 min  LATCH Score/Interventions Latch: Too sleepy or reluctant, no latch achieved, no sucking elicited. Intervention(s): Skin to skin;Teach feeding cues;Waking techniques Intervention(s): Adjust position;Assist with latch;Breast massage;Breast compression  Audible Swallowing: None Intervention(s): Skin to skin;Hand expression  Type of Nipple: Everted at rest and after stimulation  Comfort (Breast/Nipple): Soft / non-tender     Hold (Positioning): Assistance needed to correctly position infant at breast and maintain latch. Intervention(s): Support Pillows;Breastfeeding basics reviewed;Position options;Skin to skin  LATCH Score: 5  Lactation Tools Discussed/Used Tools: Shells;Pump Shell Type: Inverted Breast  pump type: Manual Pump Review: Setup, frequency, and cleaning;Milk Storage Initiated by:: Peri JeffersonL. Ashleyanne Hemmingway RN IBCLC Date initiated:: 03/18/16   Consult Status Consult Status: Follow-up Date: 03/18/16 Follow-up type: In-patient    Janayia Burggraf, Diamond NickelLAURA G 03/18/2016, 5:06 AM

## 2016-03-18 NOTE — Progress Notes (Signed)
Assessment as noted in the flow sheet. Bed bath given along with a clean gown, underwear, pad and ice pack.  Catheter emptied for 225 of clear amber colored urine. Upon sitting up in bed and dangling her feet, the patient became dizzy and laid back down. BP 98/61, HR 112, RR 22, O2 95. Instructed patient to stay in bed and call if she needs assistance, she verbalizes her understanding.

## 2016-03-18 NOTE — Anesthesia Postprocedure Evaluation (Signed)
Anesthesia Post Note  Patient: Stephanie Webster  Procedure(s) Performed: Procedure(s) (LRB): CESAREAN SECTION (N/A)  Patient location during evaluation: PACU Anesthesia Type: Epidural Level of consciousness: awake Pain management: satisfactory to patient Vital Signs Assessment: post-procedure vital signs reviewed and stable Respiratory status: spontaneous breathing Cardiovascular status: blood pressure returned to baseline Postop Assessment: no headache and spinal receding Anesthetic complications: no     Last Vitals:  Vitals:   03/18/16 0145 03/18/16 0200  BP: 127/76 119/70  Pulse: 99 88  Resp: 19 (!) 21  Temp:  36.4 C    Last Pain:  Vitals:   03/18/16 0200  TempSrc:   PainSc: 0-No pain   Pain Goal:                 Jiles GarterJACKSON,Ahmaud Duthie EDWARD

## 2016-03-18 NOTE — Progress Notes (Signed)
Subjective: Postpartum Day 1: Cesarean Delivery Patient reports tolerating PO.    Objective: Vital signs in last 24 hours: Temp:  [97.5 F (36.4 C)-100.9 F (38.3 C)] 99.7 F (37.6 C) (08/07 0545) Pulse Rate:  [73-202] 98 (08/07 0545) Resp:  [15-24] 20 (08/07 0545) BP: (99-135)/(49-98) 109/64 (08/07 0545) SpO2:  [95 %-100 %] 97 % (08/07 0545) Weight:  [168 lb (76.2 kg)] 168 lb (76.2 kg) (08/06 1406)  Physical Exam:  General: alert and cooperative Lochia: appropriate Uterine Fundus: firm, non- tender Incision: healing well DVT Evaluation: No evidence of DVT seen on physical exam. Negative Homan's sign. No cords or calf tenderness. No significant calf/ankle edema.   Recent Labs  03/17/16 0911 03/18/16 0515  HGB 12.7 11.2*  HCT 37.6 32.6*    Assessment/Plan: Status post Cesarean section. Postoperative course complicated by chorioamnionitis  Continue current care  Encourage po fluids.  Dervin Vore G 03/18/2016, 7:52 AM

## 2016-03-19 NOTE — Lactation Note (Signed)
This note was copied from a baby's chart. Lactation Consultation Note: Mother has attempt several times to breastfeed infant. She states that infant bits and chomps . Assessed infants oral cavity and found that infant has a high palate. Infant sucks poorly on a gloved finger. Taught parents suck training. Suggested a bottle nipple to see if infant could form a pattern of suckling. Infant took 10 ml very quicklly.   Advised mother to continue to offer breast with or without a nipple shield, then give bottle. Mother to pump after feeding every 2-3 hours for 15 mins. Mother was receptive to plan.   Patient Name: Girl Renaldo HarrisonDeepti Jasso ZOXWR'UToday's Date: 03/19/2016 Reason for consult: Follow-up assessment   Maternal Data    Feeding Feeding Type: Formula  LATCH Score/Interventions                      Lactation Tools Discussed/Used     Consult Status Consult Status: Follow-up Date: 03/20/16 Follow-up type: In-patient    Stevan BornKendrick, Aralyn Nowak [facility] 03/19/2016, 3:22 PM

## 2016-03-19 NOTE — Progress Notes (Signed)
Subjective: Postpartum Day 2: Cesarean Delivery Patient reports tolerating PO and no problems voiding.    Objective: Vital signs in last 24 hours: Temp:  [97.8 F (36.6 C)-98.5 F (36.9 C)] 98.2 F (36.8 C) (08/08 0546) Pulse Rate:  [87-98] 98 (08/08 0546) Resp:  [18-20] 18 (08/08 0546) BP: (97-102)/(59-74) 102/74 (08/08 0546) SpO2:  [94 %-95 %] 94 % (08/08 0546)  Physical Exam:  General: alert and cooperative Lochia: appropriate Uterine Fundus: firm Incision: healing well DVT Evaluation: No evidence of DVT seen on physical exam. Negative Homan's sign. No cords or calf tenderness. No significant calf/ankle edema.   Recent Labs  03/17/16 0911 03/18/16 0515  HGB 12.7 11.2*  HCT 37.6 32.6*    Assessment/Plan: Status post Cesarean section. Doing well postoperatively.  Continue current care.  Stephanie Webster 03/19/2016, 8:03 AM

## 2016-03-20 MED ORDER — SODIUM CHLORIDE 0.9% FLUSH: 3.0000 mL | INTRAVENOUS | Status: DC | PRN

## 2016-03-20 MED ORDER — NALBUPHINE HCL 10 MG/ML IJ SOLN: 5.0000 mg | INTRAMUSCULAR | Status: DC | PRN

## 2016-03-20 MED ORDER — NALBUPHINE HCL 10 MG/ML IJ SOLN: 5.0000 mg | Freq: Once | INTRAMUSCULAR | Status: DC | PRN

## 2016-03-20 MED ORDER — DIPHENHYDRAMINE HCL 50 MG/ML IJ SOLN: 12.5000 mg | INTRAMUSCULAR | Status: DC | PRN

## 2016-03-20 MED ORDER — NALOXONE HCL 2 MG/2ML IJ SOSY
1.0000 ug/kg/h | PREFILLED_SYRINGE | INTRAVENOUS | Status: DC | PRN
  Filled 2016-03-20: qty 2

## 2016-03-20 MED ORDER — DIPHENHYDRAMINE HCL 25 MG PO CAPS: 25.0000 mg | ORAL_CAPSULE | ORAL | Status: DC | PRN

## 2016-03-20 MED ORDER — ONDANSETRON HCL 4 MG/2ML IJ SOLN: 4.0000 mg | Freq: Three times a day (TID) | INTRAMUSCULAR | Status: DC | PRN

## 2016-03-20 MED ORDER — SCOPOLAMINE 1 MG/3DAYS TD PT72
1.0000 | MEDICATED_PATCH | Freq: Once | TRANSDERMAL | Status: DC
  Filled 2016-03-20: qty 1

## 2016-03-20 MED ORDER — NALOXONE HCL 0.4 MG/ML IJ SOLN: 0.4000 mg | INTRAMUSCULAR | Status: DC | PRN

## 2016-03-20 MED ORDER — ACETAMINOPHEN 500 MG PO TABS
1000.0000 mg | ORAL_TABLET | Freq: Four times a day (QID) | ORAL | Status: DC
  Administered 2016-03-21 (×2): 1000 mg via ORAL
  Filled 2016-03-20 (×2): qty 2

## 2016-03-20 NOTE — Progress Notes (Signed)
Subjective: Postpartum Day 3: Cesarean Delivery Patient reports tolerating PO, + flatus and no problems voiding.  Baby is under phototherapy  Objective: Vital signs in last 24 hours: Temp:  [98 F (36.7 C)-98.1 F (36.7 C)] 98.1 F (36.7 C) (08/09 0609) Pulse Rate:  [64-81] 64 (08/09 0609) Resp:  [18-19] 18 (08/09 0609) BP: (107-109)/(77-81) 109/81 (08/09 16100609)  Physical Exam:  General: alert and cooperative Lochia: appropriate Uterine Fundus: firm Incision: healing well DVT Evaluation: No evidence of DVT seen on physical exam. Negative Homan's sign. No cords or calf tenderness. No significant calf/ankle edema.   Recent Labs  03/17/16 0911 03/18/16 0515  HGB 12.7 11.2*  HCT 37.6 32.6*    Assessment/Plan: Status post Cesarean section. Doing well postoperatively.  Continue current care.  CURTIS,CAROL G 03/20/2016, 8:23 AM

## 2016-03-20 NOTE — Lactation Note (Signed)
This note was copied from a baby's chart. Lactation Consultation Note  Patient Name: Stephanie Renaldo HarrisonDeepti Webster AVWUJ'WToday's Date: 03/20/2016 Reason for consult: Follow-up assessment;Hyperbilirubinemia  Baby crying in crib, Mom had fed baby her supplement of formula 30 minutes prior.  Offered latch assist to the breast.  Manual expression caused mom to pull back in discomfort.  Areola firm, nipple erect. Baby latched fairly easily in cross cradle hold.  Basic teaching on importance of putting baby to the breast every 3 hrs or more often if cueing to feed.  Baby very relaxed with a wide, and deep latch latch, needing a little stimulation to get into a suck pattern.  Showed Mom how to identify swallowing.  Recommended she regularly pump on the Initiation Setting at every time baby feeds.  To continue supplementation, using her expressed breast milk when she can.  Baby to be skin to skin, with phototherapy paddle against baby's back as much as she can.  Mom to call for assistance prn.  Lactation to follow up prn.  Consult Status Consult Status: Follow-up Date: 03/21/16 Follow-up type: In-patient    Stephanie Webster, Stephanie Webster 03/20/2016, 10:15 AM

## 2016-03-21 MED ORDER — IBUPROFEN 600 MG PO TABS: 600.0000 mg | ORAL_TABLET | Freq: Four times a day (QID) | ORAL | 1 refills | Status: DC

## 2016-03-21 NOTE — Lactation Note (Signed)
This note was copied from a baby's chart. Lactation Consultation Note  Patient Name: Stephanie Webster WUJWJ'XToday's Date: 03/21/2016 Reason for consult: Follow-up assessment;Difficult latch Baby recently had EBM via bottle and formula but giving some feeding ques. Baby still not latching well, LC assisted Mom with positioning to latch baby, he took few suckles but would not sustain latch. Encouraged Mom to continue to offer breast each feeding, 8-12 times in 24. If baby too fussy, offer appetizer (5ml) via bottle then try to latch but after trying for 5-10 minutes if baby will not latch then pump/bottle feed. Be sure breast are emptied every 2-3 hours. Breast filling with slight engorgement starting. Engorgement care reviewed for home, ice packs given. 2 week pump rental completed for home. OP f/u scheduled for Wednesday, 03/27/16 at 10:30 am. Advised to use EBM to supplement. Advised to refer to Baby N Me booklet page 24 for engorgement care, output chart, page 25 for breast milk storage guidelines. Hand out with supplemental guidelines given to and reviewed with parents. Encouraged to call for questions/concerns.   Maternal Data    Feeding Feeding Type: Breast Fed Length of feed: 0 min  LATCH Score/Interventions Latch: Too sleepy or reluctant, no latch achieved, no sucking elicited. Intervention(s): Adjust position;Assist with latch;Breast massage;Breast compression  Audible Swallowing: None  Type of Nipple: Everted at rest and after stimulation  Comfort (Breast/Nipple): Filling, red/small blisters or bruises, mild/mod discomfort Intervention(s): Ice  Problem noted: Filling  Hold (Positioning): Assistance needed to correctly position infant at breast and maintain latch.  LATCH Score: 4  Lactation Tools Discussed/Used Tools: Pump Breast pump type: Double-Electric Breast Pump   Consult Status Consult Status: Complete Date: 03/21/16 Follow-up type: In-patient    Stephanie Webster,  Stephanie Webster 03/21/2016, 10:15 AM

## 2016-03-21 NOTE — Discharge Summary (Signed)
Obstetric Discharge Summary Reason for Admission: rupture of membranes Prenatal Procedures: ultrasound Intrapartum Procedures: cesarean: low cervical, transverse Postpartum Procedures: none Complications-Operative and Postpartum: none Hemoglobin  Date Value Ref Range Status  03/18/2016 11.2 (L) 12.0 - 15.0 g/dL Final   HCT  Date Value Ref Range Status  03/18/2016 32.6 (L) 36.0 - 46.0 % Final    Physical Exam:  General: alert and cooperative Lochia: appropriate Uterine Fundus: firm Incision: healing well DVT Evaluation: No evidence of DVT seen on physical exam. Negative Homan's sign. No cords or calf tenderness. No significant calf/ankle edema.  Discharge Diagnoses: Term Pregnancy-delivered  Discharge Information: Date: 03/21/2016 Activity: pelvic rest Diet: routine Medications: PNV and Ibuprofen Condition: stable Instructions: refer to practice specific booklet Discharge to: home   Newborn Data: Live born female  Birth Weight: 6 lb 14.6 oz (3135 g) APGAR: 9, 9  Home with mother.  Stephanie Webster G 03/21/2016, 8:34 AM

## 2016-03-27 ENCOUNTER — Ambulatory Visit (HOSPITAL_COMMUNITY): Payer: 59

## 2016-09-03 DIAGNOSIS — E041 Nontoxic single thyroid nodule: Secondary | ICD-10-CM | POA: Diagnosis not present

## 2017-02-26 ENCOUNTER — Other Ambulatory Visit: Payer: Self-pay | Admitting: Otolaryngology

## 2017-02-26 DIAGNOSIS — E041 Nontoxic single thyroid nodule: Secondary | ICD-10-CM

## 2017-04-03 ENCOUNTER — Other Ambulatory Visit: Payer: 59

## 2017-05-23 ENCOUNTER — Other Ambulatory Visit: Payer: Self-pay | Admitting: Otolaryngology

## 2017-05-23 DIAGNOSIS — E041 Nontoxic single thyroid nodule: Secondary | ICD-10-CM

## 2017-05-27 ENCOUNTER — Other Ambulatory Visit: Payer: 59

## 2017-05-28 ENCOUNTER — Ambulatory Visit
Admission: RE | Admit: 2017-05-28 | Discharge: 2017-05-28 | Disposition: A | Discharge status: Home / Self Care | Payer: 59 | Source: Ambulatory Visit | Attending: Otolaryngology | Admitting: Otolaryngology

## 2017-05-28 DIAGNOSIS — E041 Nontoxic single thyroid nodule: Secondary | ICD-10-CM

## 2017-05-28 DIAGNOSIS — E042 Nontoxic multinodular goiter: Secondary | ICD-10-CM | POA: Diagnosis not present

## 2017-07-18 DIAGNOSIS — Z01419 Encounter for gynecological examination (general) (routine) without abnormal findings: Secondary | ICD-10-CM | POA: Diagnosis not present

## 2017-08-25 DIAGNOSIS — Z0000 Encounter for general adult medical examination without abnormal findings: Secondary | ICD-10-CM | POA: Diagnosis not present

## 2017-08-25 DIAGNOSIS — E559 Vitamin D deficiency, unspecified: Secondary | ICD-10-CM | POA: Diagnosis not present

## 2017-08-25 DIAGNOSIS — Z23 Encounter for immunization: Secondary | ICD-10-CM | POA: Diagnosis not present

## 2017-10-06 IMAGING — US US SOFT TISSUE HEAD/NECK
1 series · 14 of 25 positions shown · non-contrast
Comparison: None.

CLINICAL DATA: Enlarged thyroid on physical examination.

EXAM:
THYROID ULTRASOUND
TECHNIQUE: Ultrasound examination of the thyroid gland and adjacent soft
tissues was performed.

[Series 1: us soft tissue head/neck · 0.05mm/px · 14 of 51 slices shown]
[im 1/51]
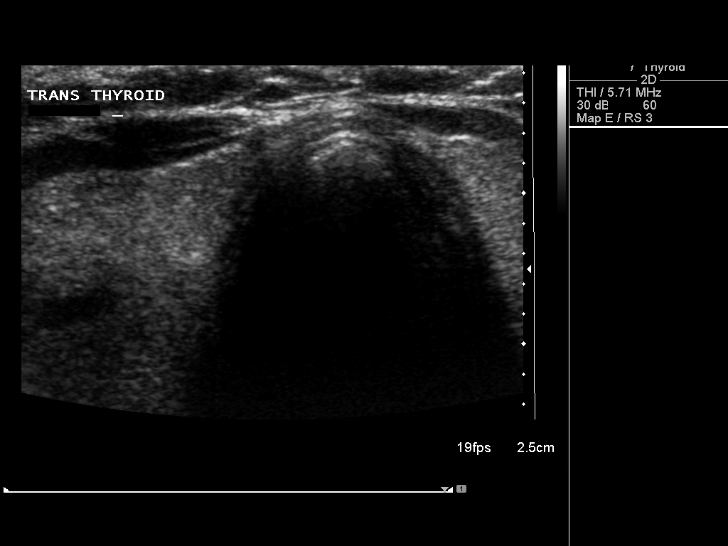
[im 5/51]
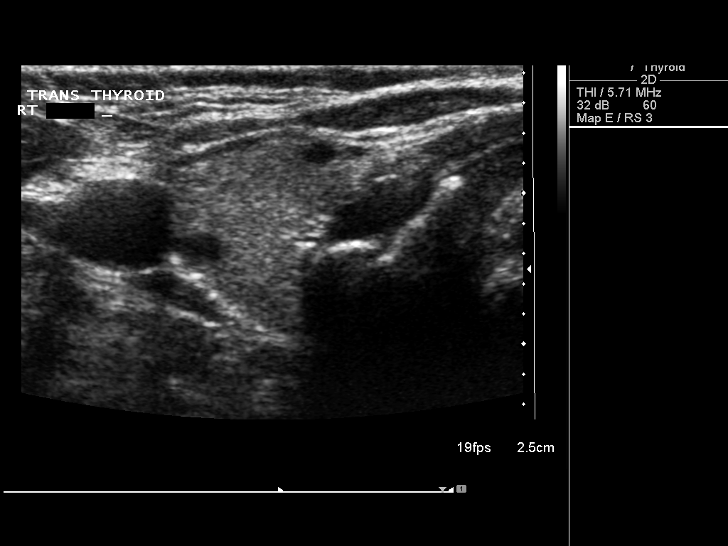
[im 9/51]
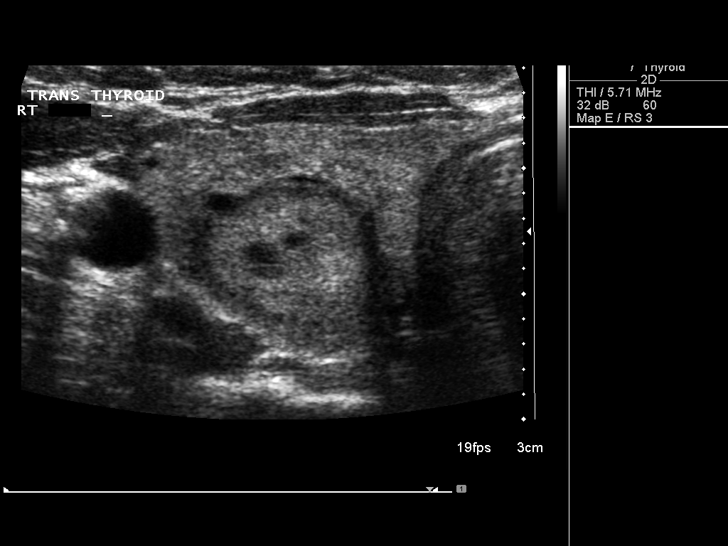
[im 13/51]
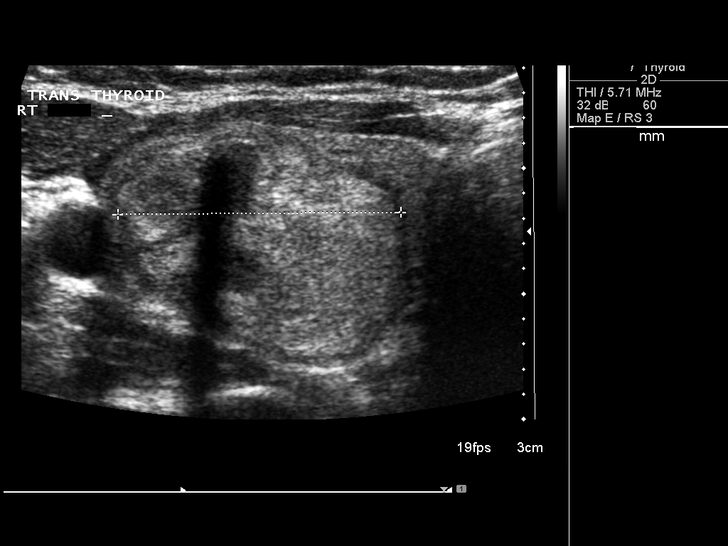
[im 17/51]
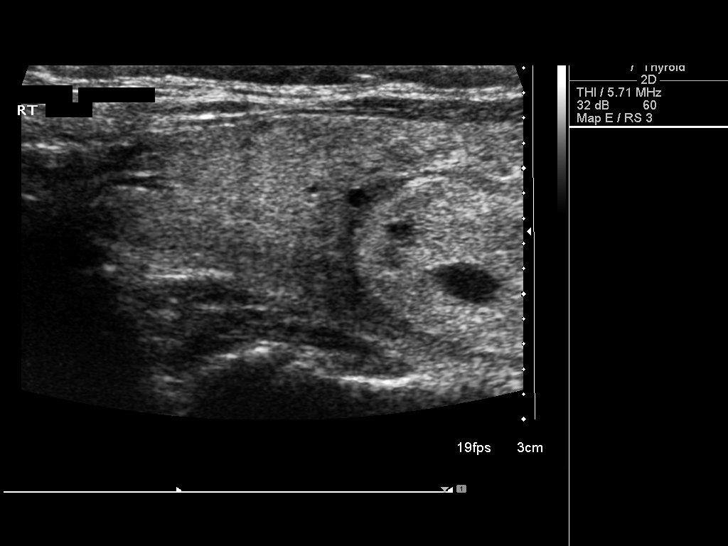
[im 19/51]
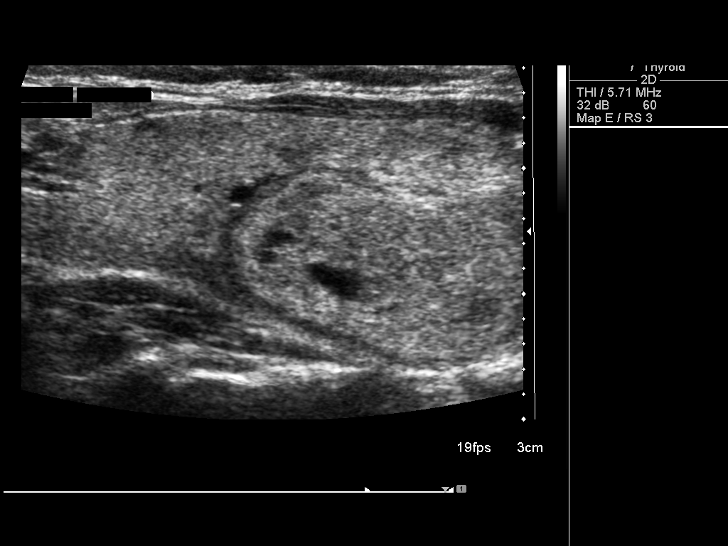
[im 23/51]
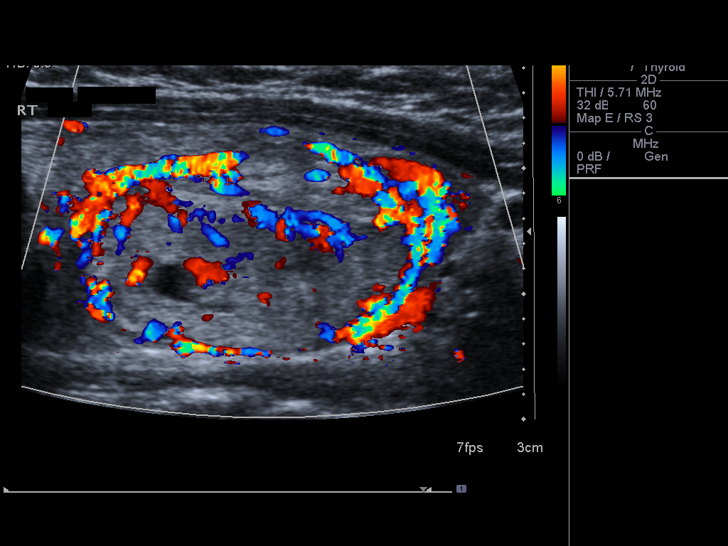
[im 28/51]
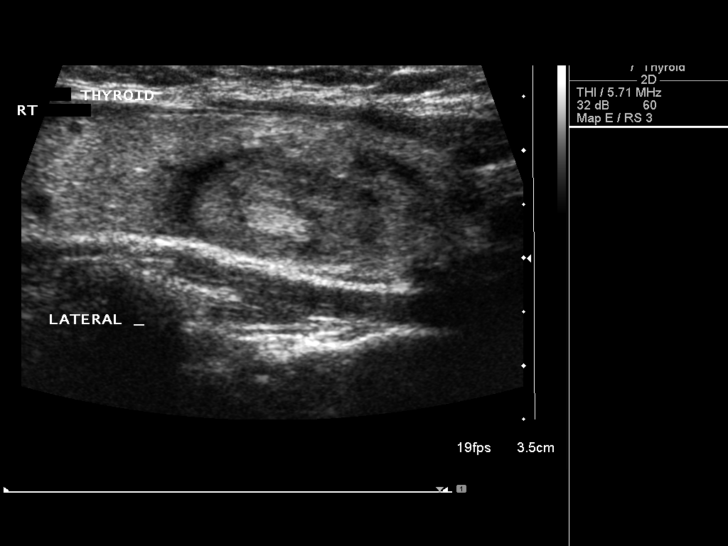
[im 32/51]
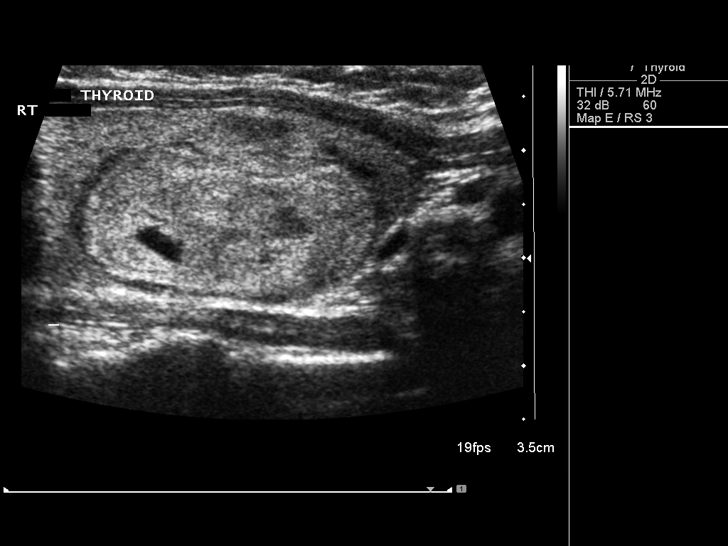
[im 34/51]
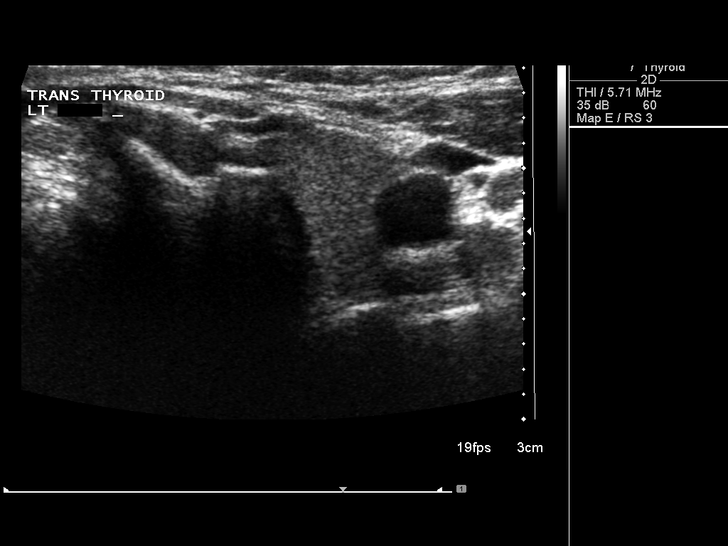
[im 38/51]
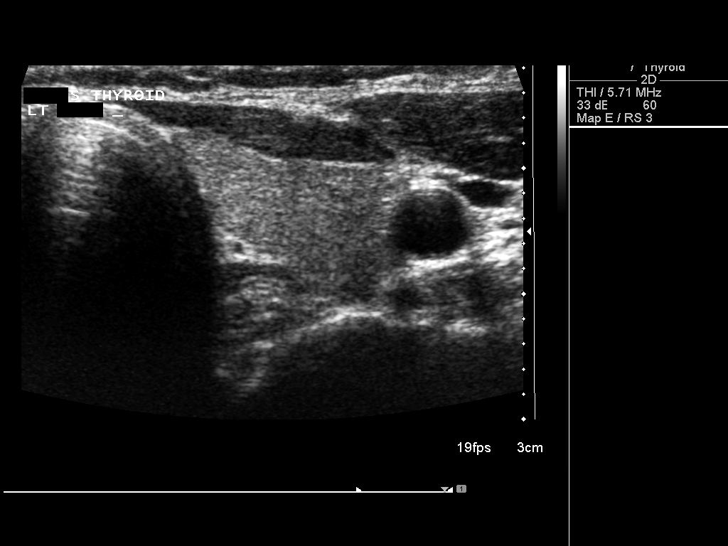
[im 42/51]
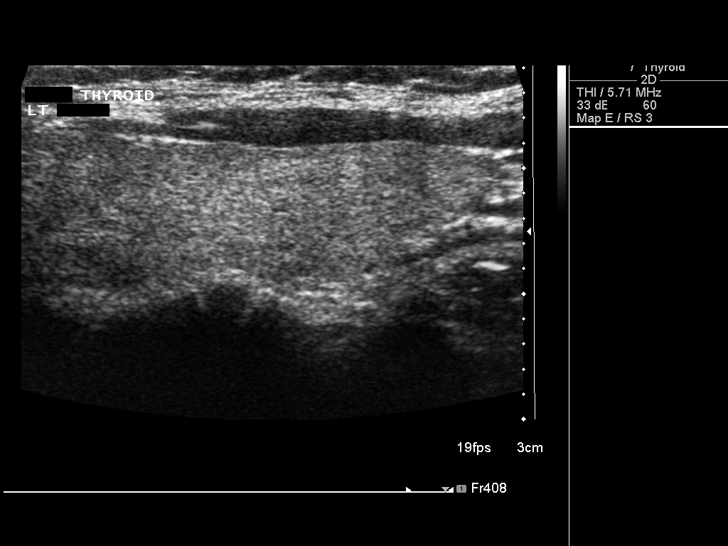
[im 46/51]
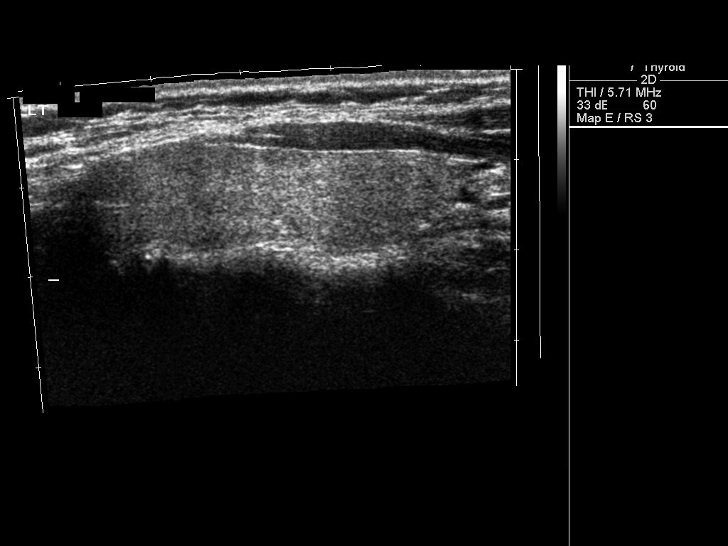
[im 51/51]
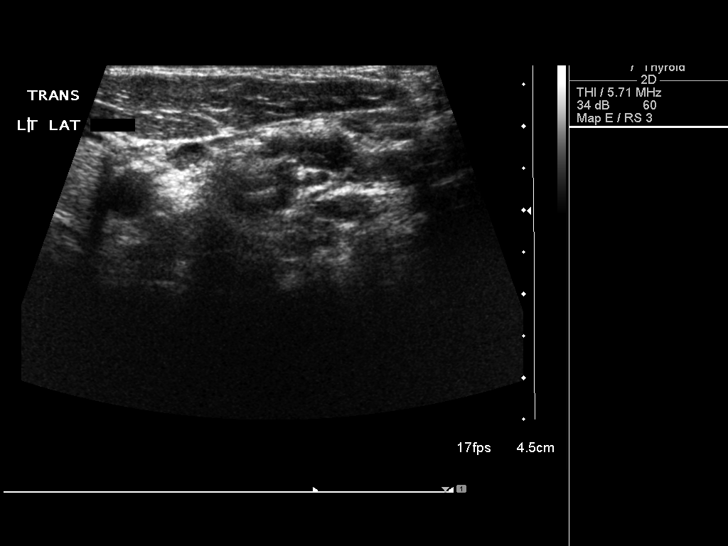

[14 of 25 positions shown; findings below may reference images not displayed]

FINDINGS: Right thyroid lobe

Measurements: 5.5 x 1.8 x 2.3 cm. Small hypoechoic nodule along the
superior right thyroid lobe measures up to 0.3 cm. There is a mildly
heterogeneous nodule involving the mid and inferior right thyroid
lobe that measures 2.8 x 1.7 x 2.3 cm. This nodule is moderately
hypervascular. There is a dystrophic calcification along the
anterior aspect of this nodule. This calcification measures up to
0.5 cm.

Left thyroid lobe

Measurements: 4.4 x 1.3 x 1.6 cm. Left thyroid tissue is homogeneous
without a discrete nodule.

Isthmus

Thickness: 0.2 cm.  No nodules visualized.

Lymphadenopathy

None visualized.
IMPRESSION: Right thyroid nodules. The dominant right thyroid nodule meets
criteria for ultrasound-guided biopsy. This recommendation follows
the consensus statement: Management of Thyroid Nodules Detected at
US: Society of Radiologists in Ultrasound Consensus Conference

## 2017-10-22 IMAGING — US US THYROID BIOPSY
1 series · 13 of 14 positions shown · non-contrast
Comparison: Thyroid ultrasound done 09/11/2015.

INDICATION: Mildly heterogeneous nodule involving the mid and inferior right
thyroid lobe that measures 2.8 x 1.7 x 2.3 cm. Request for
ultrasound guided fine needle aspirate biopsy.

EXAM:
ULTRASOUND GUIDED NEEDLE ASPIRATE BIOPSY OF THE THYROID GLAND
MEDICATIONS:
1% Lidocaine.
ANESTHESIA/SEDATION:
No sedation medication given.

[Series 1: us thyroid biopsy · 0.07mm/px · 14 acquisitions, 13 frames shown]
[im 1/14]
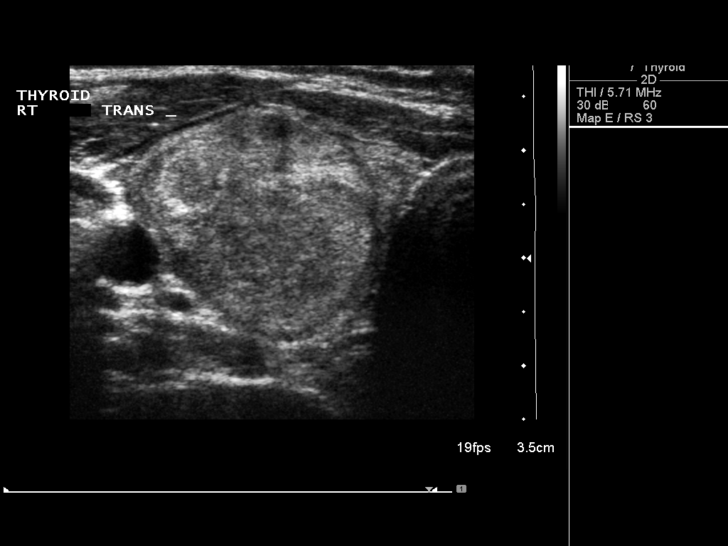
[im 2/14]
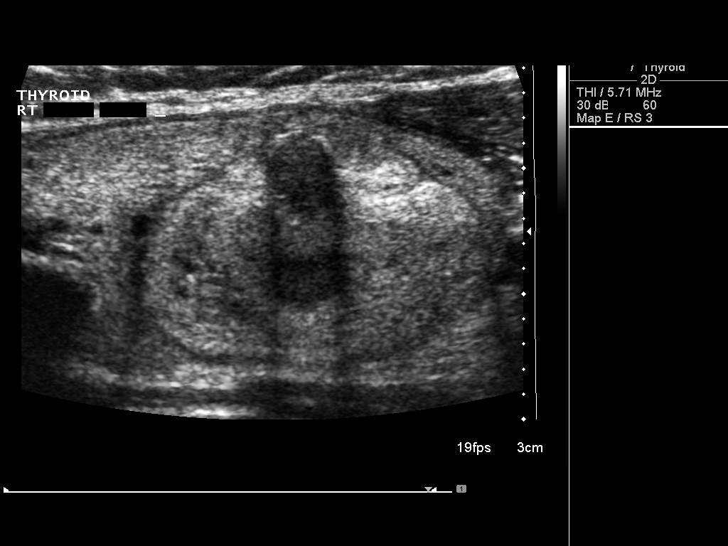
[im 3/14]
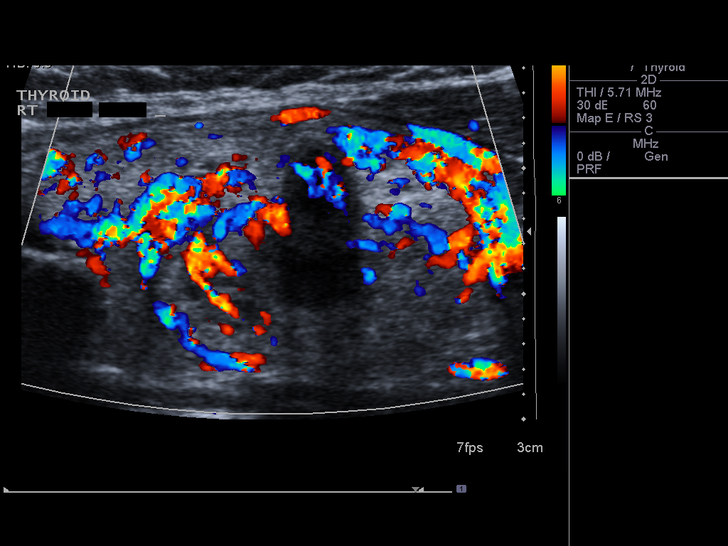
[im 4/14]
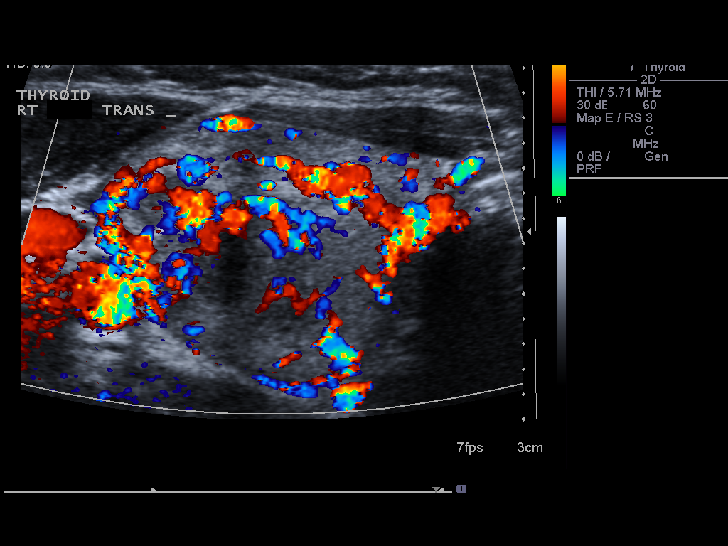
[im 5/14]
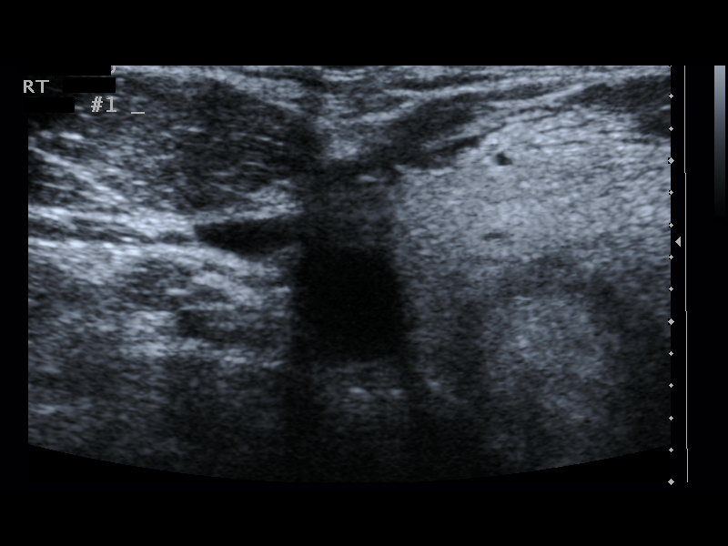
[im 6/14]
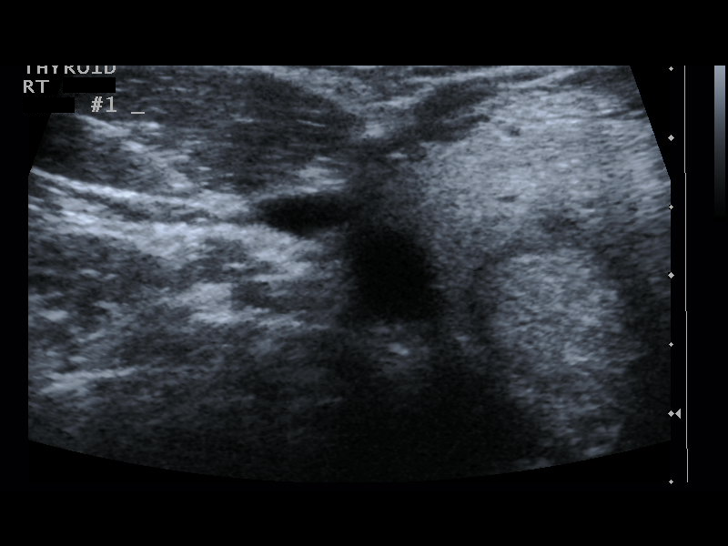
[im 8/14]
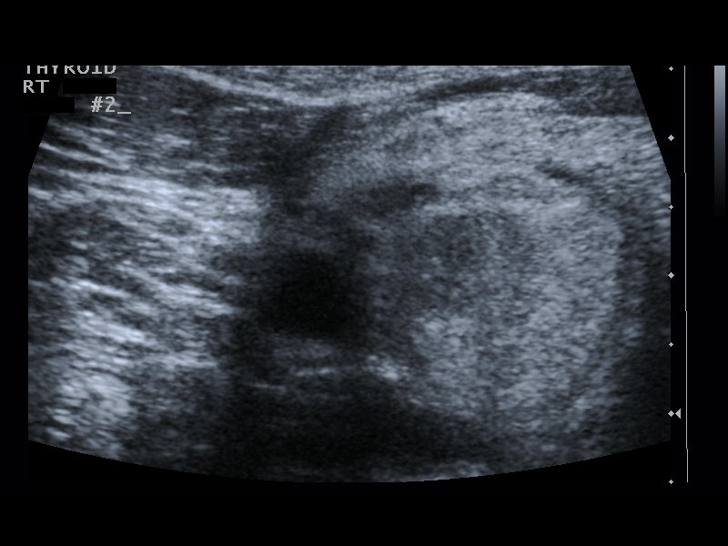
[im 9/14]
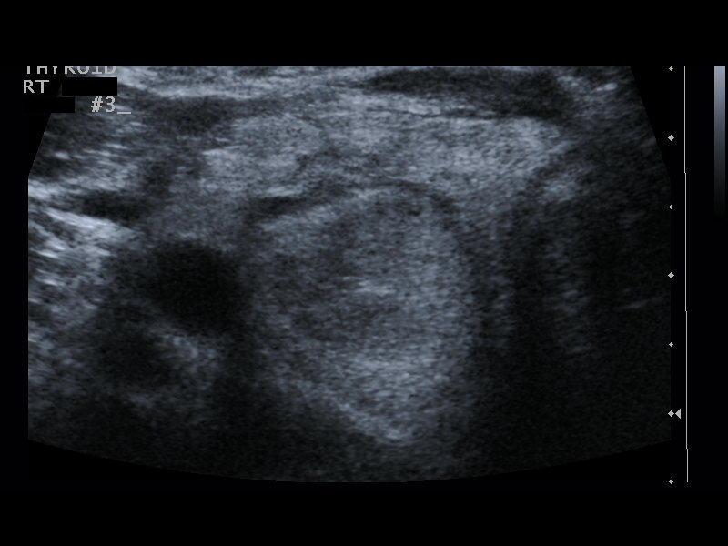
[im 10/14]
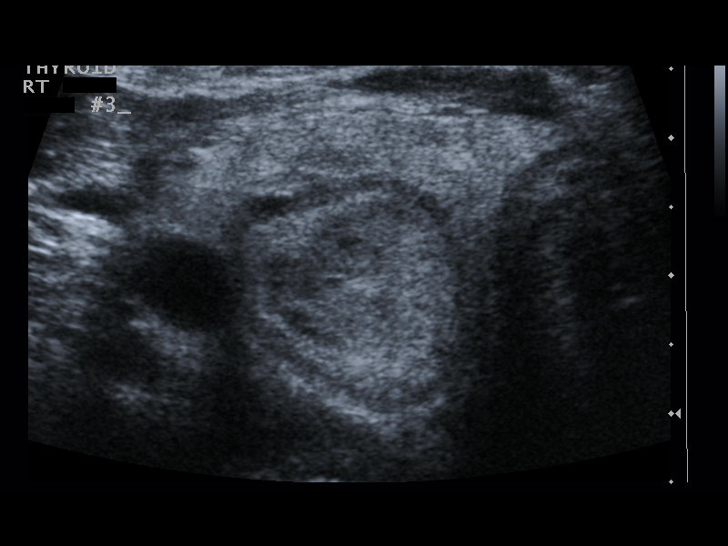
[im 11/14]
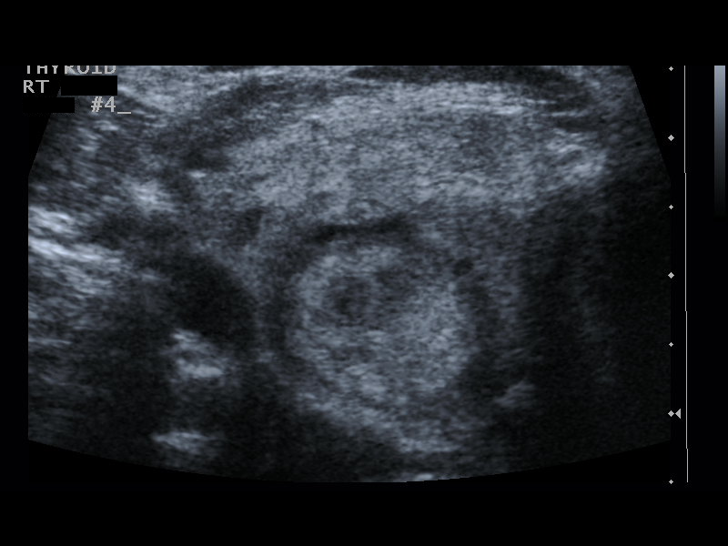
[im 12/14]
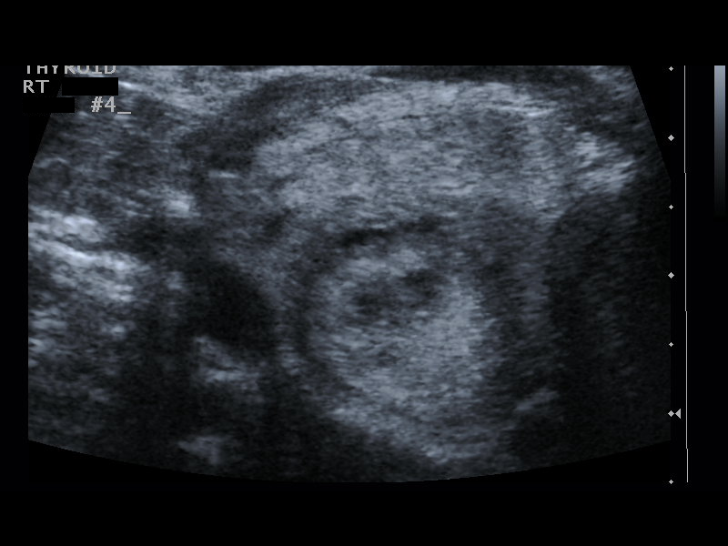
[im 13/14]
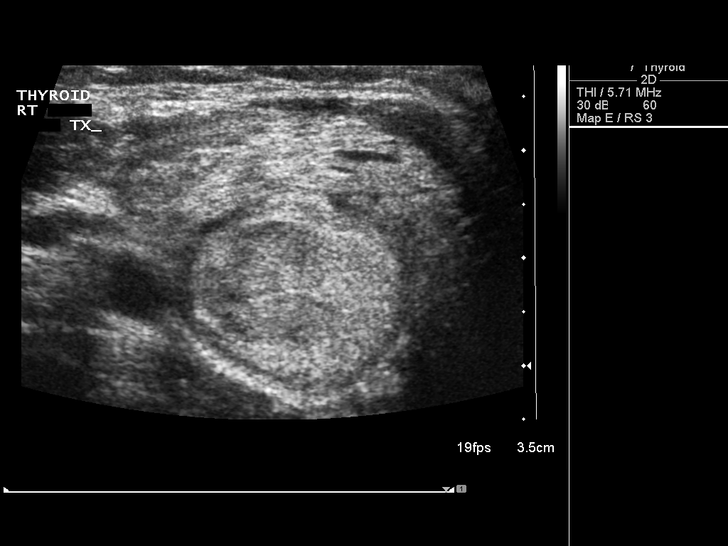
[im 14/14]
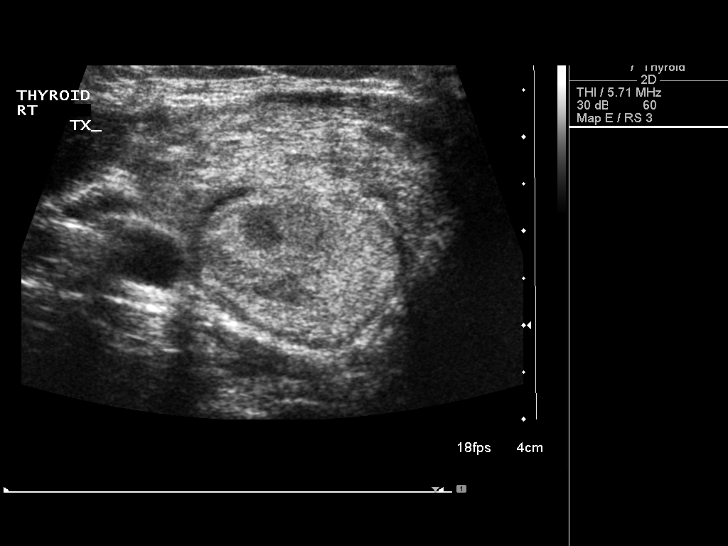

[13 of 14 positions shown; findings below may reference images not displayed]

COMPLICATIONS:
None immediate.

PROCEDURE:
Thyroid biopsy was thoroughly discussed with the patient and
questions were answered. The benefits, risks, alternatives, and
complications were also discussed. The patient understands and
wishes to proceed with the procedure. Written consent was obtained.

Ultrasound was performed to localize and mark an adequate site for
the biopsy. The patient was then prepped and draped in a normal
sterile fashion. Local anesthesia was provided with 1% lidocaine.
Using direct ultrasound guidance, 4 passes were made using needles
into the nodule within the right lobe of the thyroid. Ultrasound was
used to confirm needle placements on all occasions. Specimens were
sent to Pathology for analysis.
IMPRESSION: Ultrasound guided needle aspirate biopsy performed of the right
thyroid nodule.

## 2017-11-22 DIAGNOSIS — R21 Rash and other nonspecific skin eruption: Secondary | ICD-10-CM | POA: Diagnosis not present

## 2017-11-22 DIAGNOSIS — L239 Allergic contact dermatitis, unspecified cause: Secondary | ICD-10-CM | POA: Diagnosis not present

## 2017-11-22 DIAGNOSIS — L508 Other urticaria: Secondary | ICD-10-CM | POA: Diagnosis not present

## 2017-12-06 DIAGNOSIS — Z889 Allergy status to unspecified drugs, medicaments and biological substances status: Secondary | ICD-10-CM | POA: Diagnosis not present

## 2017-12-06 DIAGNOSIS — L508 Other urticaria: Secondary | ICD-10-CM | POA: Diagnosis not present

## 2017-12-10 DIAGNOSIS — L508 Other urticaria: Secondary | ICD-10-CM | POA: Diagnosis not present

## 2018-06-25 DIAGNOSIS — S0501XA Injury of conjunctiva and corneal abrasion without foreign body, right eye, initial encounter: Secondary | ICD-10-CM | POA: Diagnosis not present

## 2018-06-26 DIAGNOSIS — S0501XD Injury of conjunctiva and corneal abrasion without foreign body, right eye, subsequent encounter: Secondary | ICD-10-CM | POA: Diagnosis not present

## 2018-06-30 DIAGNOSIS — S0501XD Injury of conjunctiva and corneal abrasion without foreign body, right eye, subsequent encounter: Secondary | ICD-10-CM | POA: Diagnosis not present

## 2018-09-03 ENCOUNTER — Ambulatory Visit (INDEPENDENT_AMBULATORY_CARE_PROVIDER_SITE_OTHER): Payer: 59 | Admitting: Internal Medicine

## 2018-09-03 ENCOUNTER — Encounter: Payer: Self-pay | Admitting: Internal Medicine

## 2018-09-03 VITALS — BP 110/72 | HR 83 | Temp 98.3°F | Ht 62.5 in | Wt 152.2 lb

## 2018-09-03 DIAGNOSIS — Z0000 Encounter for general adult medical examination without abnormal findings: Secondary | ICD-10-CM

## 2018-09-03 LAB — POCT URINALYSIS DIPSTICK
Bilirubin, UA: NEGATIVE
GLUCOSE UA: NEGATIVE
KETONES UA: NEGATIVE
NITRITE UA: NEGATIVE
PROTEIN UA: NEGATIVE
Spec Grav, UA: 1.02 (ref 1.010–1.025)
Urobilinogen, UA: 0.2 E.U./dL
pH, UA: 5.5 (ref 5.0–8.0)

## 2018-09-03 NOTE — Patient Instructions (Signed)

## 2018-09-04 LAB — CBC
HEMATOCRIT: 40 % (ref 34.0–46.6)
Hemoglobin: 13.2 g/dL (ref 11.1–15.9)
MCH: 28.6 pg (ref 26.6–33.0)
MCHC: 33 g/dL (ref 31.5–35.7)
MCV: 87 fL (ref 79–97)
Platelets: 367 10*3/uL (ref 150–450)
RBC: 4.61 x10E6/uL (ref 3.77–5.28)
RDW: 12.4 % (ref 11.7–15.4)
WBC: 5.9 10*3/uL (ref 3.4–10.8)

## 2018-09-04 LAB — LIPID PANEL
CHOL/HDL RATIO: 4.1 ratio (ref 0.0–4.4)
CHOLESTEROL TOTAL: 210 mg/dL — AB (ref 100–199)
HDL: 51 mg/dL (ref >39)
LDL Calculated: 122 mg/dL — ABNORMAL HIGH (ref 0–99)
TRIGLYCERIDES: 184 mg/dL — AB (ref 0–149)
VLDL CHOLESTEROL CAL: 37 mg/dL (ref 5–40)

## 2018-09-04 LAB — CMP14+EGFR
ALT: 24 IU/L (ref 0–32)
AST: 20 IU/L (ref 0–40)
Albumin/Globulin Ratio: 1.6 (ref 1.2–2.2)
Albumin: 4.4 g/dL (ref 3.8–4.8)
Alkaline Phosphatase: 71 IU/L (ref 39–117)
BUN/Creatinine Ratio: 13 (ref 9–23)
BUN: 8 mg/dL (ref 6–20)
Bilirubin Total: 0.3 mg/dL (ref 0.0–1.2)
CALCIUM: 9 mg/dL (ref 8.7–10.2)
CO2: 22 mmol/L (ref 20–29)
CREATININE: 0.6 mg/dL (ref 0.57–1.00)
Chloride: 99 mmol/L (ref 96–106)
GFR, EST AFRICAN AMERICAN: 135 mL/min/{1.73_m2} (ref >59)
GFR, EST NON AFRICAN AMERICAN: 117 mL/min/{1.73_m2} (ref >59)
Globulin, Total: 2.7 g/dL (ref 1.5–4.5)
Glucose: 91 mg/dL (ref 65–99)
Potassium: 4.4 mmol/L (ref 3.5–5.2)
Sodium: 136 mmol/L (ref 134–144)
TOTAL PROTEIN: 7.1 g/dL (ref 6.0–8.5)

## 2018-09-04 LAB — HEMOGLOBIN A1C
ESTIMATED AVERAGE GLUCOSE: 111 mg/dL
Hgb A1c MFr Bld: 5.5 % (ref 4.8–5.6)

## 2018-09-05 ENCOUNTER — Encounter: Payer: Self-pay | Admitting: Internal Medicine

## 2018-09-05 NOTE — Progress Notes (Signed)
Subjective:     Patient ID: Stephanie Webster , female    DOB: 11-Apr-1981 , 38 y.o.   MRN: [id number]   Chief Complaint  Patient presents with  . Annual Exam    HPI  She is here today for a full physical examination. She is followed by Gyn for her pelvic exams. She has no specific concerns at this time.     Past Medical History:  Diagnosis Date  . Medical history non-contributory      Family History  Problem Relation Age of Onset  . Cancer Mother   . Diabetes Mother   . Endometrial cancer Mother   . Diabetes Father      Current Outpatient Medications:  .  Cholecalciferol (VITAMIN D3) 125 MCG (5000 UT) CAPS, Take by mouth., Disp: , Rfl:  .  Multiple Vitamins-Minerals (MULTIVITAMIN ADULTS PO), Take by mouth., Disp: , Rfl:    No Known Allergies     Last LMP was Patient's last menstrual period was 07/27/2018.. Negative for: breast discharge, breast lump(s), breast pain and breast self exam. Associated symptoms include abnormal vaginal bleeding. Pertinent negatives include abnormal bleeding (hematology), anxiety, decreased libido, depression, difficulty falling sleep, dyspareunia, history of infertility, nocturia, sexual dysfunction, sleep disturbances, urinary incontinence, urinary urgency, vaginal discharge and vaginal itching. Diet regular.The patient states her exercise level is   minimal.   . The patient's tobacco use is:  Social History   Tobacco Use  Smoking Status Never Smoker  Smokeless Tobacco Never Used  . She has been exposed to passive smoke. The patient's alcohol use is:  Social History   Substance and Sexual Activity  Alcohol Use No    Review of Systems  Constitutional: Negative.   Eyes: Negative.   Respiratory: Negative.   Cardiovascular: Negative.   Gastrointestinal: Negative.   Endocrine: Negative.   Genitourinary: Negative.   Musculoskeletal: Negative.   Skin: Negative.   Allergic/Immunologic: Negative.   Neurological: Negative.    Hematological: Negative.   Psychiatric/Behavioral: Negative.      Today's Vitals   09/03/18 0926  BP: 110/72  Pulse: 83  Temp: 98.3 F (36.8 C)  TempSrc: Oral  Weight: 152 lb 3.2 oz (69 kg)  Height: 5' 2.5" (1.588 m)   Body mass index is 27.39 kg/m.   Objective:  Physical Exam Vitals signs and nursing note reviewed.  Constitutional:      Appearance: Normal appearance.  HENT:     Head: Normocephalic and atraumatic.     Right Ear: Tympanic membrane, ear canal and external ear normal.     Left Ear: Tympanic membrane, ear canal and external ear normal.     Nose: Nose normal.     Mouth/Throat:     Mouth: Mucous membranes are moist.     Pharynx: Oropharynx is clear.  Eyes:     Pupils: Pupils are equal, round, and reactive to light.  Neck:     Musculoskeletal: Normal range of motion and neck supple.  Cardiovascular:     Rate and Rhythm: Normal rate and regular rhythm.     Pulses: Normal pulses.     Heart sounds: Normal heart sounds.  Pulmonary:     Effort: Pulmonary effort is normal.     Breath sounds: Normal breath sounds.  Abdominal:     General: Abdomen is flat. Bowel sounds are normal.  Genitourinary:    Comments: deferred Musculoskeletal: Normal range of motion.  Skin:    General: Skin is warm.  Neurological:     General:  No focal deficit present.     Mental Status: She is alert and oriented to person, place, and time.         Assessment And Plan:     1. Routine general medical examination at health care facility  A full exam was performed. Importance of monthly self breast exams was discussed with the patient.  PATIENT HAS BEEN ADVISED TO GET 30-45 MINUTES REGULAR EXERCISE NO LESS THAN FOUR TO FIVE DAYS PER WEEK - BOTH WEIGHTBEARING EXERCISES AND AEROBIC ARE RECOMMENDED.  SHE IS ADVISED TO FOLLOW A HEALTHY DIET WITH AT LEAST SIX FRUITS/VEGGIES PER DAY, DECREASE INTAKE OF RED MEAT, AND TO INCREASE FISH INTAKE TO TWO DAYS PER WEEK.  MEATS/FISH SHOULD NOT BE  FRIED, BAKED OR BROILED IS PREFERABLE.  I SUGGEST WEARING SPF 50 SUNSCREEN ON EXPOSED PARTS AND ESPECIALLY WHEN IN THE DIRECT SUNLIGHT FOR AN EXTENDED PERIOD OF TIME.  PLEASE AVOID FAST FOOD RESTAURANTS AND INCREASE YOUR WATER INTAKE.  - CMP14+EGFR - CBC - Lipid panel - Hemoglobin A1c - POCT Urinalysis Dipstick (04591)        Maximino Greenland, MD

## 2018-09-10 DIAGNOSIS — Z01419 Encounter for gynecological examination (general) (routine) without abnormal findings: Secondary | ICD-10-CM | POA: Diagnosis not present

## 2018-09-10 DIAGNOSIS — Z6826 Body mass index (BMI) 26.0-26.9, adult: Secondary | ICD-10-CM | POA: Diagnosis not present

## 2018-11-10 ENCOUNTER — Other Ambulatory Visit: Payer: Self-pay | Admitting: Otolaryngology

## 2018-11-10 DIAGNOSIS — E041 Nontoxic single thyroid nodule: Secondary | ICD-10-CM

## 2019-10-28 ENCOUNTER — Ambulatory Visit: Payer: Self-pay | Attending: Internal Medicine

## 2021-05-17 ENCOUNTER — Other Ambulatory Visit: Payer: Self-pay | Admitting: Internal Medicine

## 2021-05-17 DIAGNOSIS — E041 Nontoxic single thyroid nodule: Secondary | ICD-10-CM

## 2023-09-03 ENCOUNTER — Other Ambulatory Visit: Payer: Self-pay | Admitting: Obstetrics & Gynecology

## 2023-09-03 DIAGNOSIS — E049 Nontoxic goiter, unspecified: Secondary | ICD-10-CM

## 2023-09-15 ENCOUNTER — Other Ambulatory Visit: Payer: Self-pay

## 2023-09-29 ENCOUNTER — Ambulatory Visit
Admission: RE | Admit: 2023-09-29 | Discharge: 2023-09-29 | Disposition: A | Discharge status: Home / Self Care | Payer: 59 | Source: Ambulatory Visit | Attending: Obstetrics & Gynecology | Admitting: Obstetrics & Gynecology

## 2023-09-29 DIAGNOSIS — E049 Nontoxic goiter, unspecified: Secondary | ICD-10-CM

## 2024-04-21 ENCOUNTER — Other Ambulatory Visit: Payer: Self-pay | Admitting: Nurse Practitioner

## 2024-04-21 DIAGNOSIS — E041 Nontoxic single thyroid nodule: Secondary | ICD-10-CM

## 2024-05-12 ENCOUNTER — Encounter: Payer: Self-pay | Admitting: Internal Medicine

## 2024-05-14 ENCOUNTER — Ambulatory Visit
Admission: RE | Admit: 2024-05-14 | Discharge: 2024-05-14 | Disposition: A | Discharge status: Home / Self Care | Payer: Self-pay | Source: Ambulatory Visit | Attending: Nurse Practitioner | Admitting: Nurse Practitioner

## 2024-05-14 DIAGNOSIS — E041 Nontoxic single thyroid nodule: Secondary | ICD-10-CM
